# Patient Record
Sex: Female | Born: 1965 | Race: Black or African American | Hispanic: No | Marital: Married | Smoking: Never smoker
Health system: Southern US, Community
[De-identification: ages and names within clinical notes are randomized; demographics above are authoritative.]

## PROBLEM LIST (undated history)

## (undated) HISTORY — PX: BREAST BIOPSY: SHX20

---
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
## 2003-06-26 HISTORY — PX: CHOLECYSTECTOMY: SHX55

## 2010-10-12 ENCOUNTER — Other Ambulatory Visit: Payer: Self-pay | Admitting: Obstetrics & Gynecology

## 2010-10-12 DIAGNOSIS — Z1231 Encounter for screening mammogram for malignant neoplasm of breast: Secondary | ICD-10-CM

## 2010-10-20 ENCOUNTER — Ambulatory Visit (HOSPITAL_COMMUNITY)
Admission: RE | Admit: 2010-10-20 | Discharge: 2010-10-20 | Disposition: A | Discharge status: Home / Self Care | Payer: BC Managed Care – PPO | Source: Ambulatory Visit | Attending: Obstetrics & Gynecology | Admitting: Obstetrics & Gynecology

## 2010-10-20 DIAGNOSIS — Z1231 Encounter for screening mammogram for malignant neoplasm of breast: Secondary | ICD-10-CM | POA: Insufficient documentation

## 2012-06-16 ENCOUNTER — Other Ambulatory Visit: Payer: Self-pay | Admitting: Obstetrics & Gynecology

## 2012-06-16 DIAGNOSIS — Z1231 Encounter for screening mammogram for malignant neoplasm of breast: Secondary | ICD-10-CM

## 2012-06-17 ENCOUNTER — Ambulatory Visit (HOSPITAL_COMMUNITY)
Admission: RE | Admit: 2012-06-17 | Discharge: 2012-06-17 | Disposition: A | Discharge status: Home / Self Care | Payer: 59 | Source: Ambulatory Visit | Attending: Obstetrics & Gynecology | Admitting: Obstetrics & Gynecology

## 2012-06-17 DIAGNOSIS — Z1231 Encounter for screening mammogram for malignant neoplasm of breast: Secondary | ICD-10-CM | POA: Insufficient documentation

## 2012-06-24 ENCOUNTER — Other Ambulatory Visit: Payer: Self-pay | Admitting: Obstetrics & Gynecology

## 2012-06-24 DIAGNOSIS — R928 Other abnormal and inconclusive findings on diagnostic imaging of breast: Secondary | ICD-10-CM

## 2012-07-03 ENCOUNTER — Ambulatory Visit
Admission: RE | Admit: 2012-07-03 | Discharge: 2012-07-03 | Disposition: A | Discharge status: Home / Self Care | Payer: 59 | Source: Ambulatory Visit | Attending: Obstetrics & Gynecology | Admitting: Obstetrics & Gynecology

## 2012-07-03 ENCOUNTER — Other Ambulatory Visit: Payer: Self-pay | Admitting: Obstetrics & Gynecology

## 2012-07-03 DIAGNOSIS — R928 Other abnormal and inconclusive findings on diagnostic imaging of breast: Secondary | ICD-10-CM

## 2012-07-11 ENCOUNTER — Ambulatory Visit
Admission: RE | Admit: 2012-07-11 | Discharge: 2012-07-11 | Disposition: A | Discharge status: Home / Self Care | Payer: 59 | Source: Ambulatory Visit | Attending: Obstetrics & Gynecology | Admitting: Obstetrics & Gynecology

## 2012-07-11 DIAGNOSIS — R928 Other abnormal and inconclusive findings on diagnostic imaging of breast: Secondary | ICD-10-CM

## 2012-11-26 ENCOUNTER — Other Ambulatory Visit: Payer: Self-pay | Admitting: *Deleted

## 2012-11-27 NOTE — Telephone Encounter (Signed)
Error

## 2013-01-15 ENCOUNTER — Ambulatory Visit: Payer: Self-pay | Admitting: Obstetrics & Gynecology

## 2013-01-29 ENCOUNTER — Encounter: Payer: Self-pay | Admitting: Obstetrics & Gynecology

## 2013-01-29 ENCOUNTER — Ambulatory Visit (INDEPENDENT_AMBULATORY_CARE_PROVIDER_SITE_OTHER): Payer: 59 | Admitting: Obstetrics & Gynecology

## 2013-01-29 VITALS — BP 148/90 | HR 83 | Temp 98.3°F | Ht 68.0 in | Wt 249.0 lb

## 2013-01-29 DIAGNOSIS — Z01419 Encounter for gynecological examination (general) (routine) without abnormal findings: Secondary | ICD-10-CM

## 2013-01-29 DIAGNOSIS — Z0000 Encounter for general adult medical examination without abnormal findings: Secondary | ICD-10-CM

## 2013-01-29 NOTE — Progress Notes (Signed)
.   Subjective:     Grace Crosby is a 47 y.o. female here for a routine exam.  No current complaints: .  Personal health questionnaire reviewed: no.   Gynecologic History Patient's last menstrual period was 01/15/2013. Contraception: OCP (estrogen/progesterone) Last Pap:08/2011 . Results were: normal Last mammogram: 06/2012. Results were: normal  Obstetric History OB History   Grav Para Term Preterm Abortions TAB SAB Ect Mult Living                   The following portions of the patient's history were reviewed and updated as appropriate: allergies, current medications, past family history, past medical history, past social history, past surgical history and problem list.  Review of Systems Pertinent items are noted in HPI.    Objective:      General appearance: alert Breasts: normal appearance, no masses or tenderness Abdomen: soft, non-tender; bowel sounds normal; no masses,  no organomegaly Pelvic: cervix normal in appearance, external genitalia normal, no adnexal masses or tenderness, uterus normal size, shape, and consistency and vagina normal without discharge    Assessment:    Healthy female exam.   H/O a small fibroid uterus--stable Plan:    Return prn

## 2013-01-30 ENCOUNTER — Encounter: Payer: Self-pay | Admitting: Obstetrics & Gynecology

## 2013-02-02 LAB — PAP IG, CT-NG, RFX HPV ASCU: GC Probe Amp: NEGATIVE

## 2013-04-23 ENCOUNTER — Encounter: Payer: Self-pay | Admitting: Obstetrics & Gynecology

## 2013-04-23 ENCOUNTER — Ambulatory Visit (INDEPENDENT_AMBULATORY_CARE_PROVIDER_SITE_OTHER): Payer: 59 | Admitting: Obstetrics & Gynecology

## 2013-04-23 VITALS — BP 130/87 | HR 79 | Temp 98.2°F | Wt 250.0 lb

## 2013-04-23 DIAGNOSIS — N926 Irregular menstruation, unspecified: Secondary | ICD-10-CM

## 2013-04-23 DIAGNOSIS — Z3202 Encounter for pregnancy test, result negative: Secondary | ICD-10-CM

## 2013-04-23 LAB — POCT URINE PREGNANCY: Preg Test, Ur: NEGATIVE

## 2013-04-23 NOTE — Progress Notes (Signed)
Subjective:     Grace Crosby is a 47 y.o. female here for a problem exam.  Current complaints: pt reports  Having irregular menstrual cycles since June of this year, states she.  Personal health questionnaire reviewed: yes.   Gynecologic History Patient's last menstrual period was 04/10/2013. Contraception: none Last Pap: 01/2013. Results were: normal   Obstetric History OB History  No data available     The following portions of the patient's history were reviewed and updated as appropriate: allergies, current medications, past family history, past medical history, past social history, past surgical history and problem list.  Review of Systems Pertinent items are noted in HPI.    Objective:      General:  alert     Abdomen: soft, non-tender; bowel sounds normal; no masses,  no organomegaly   Vulva:  normal  Vagina: normal vagina  Cervix:  no lesions  Corpus: normal size, contour, position, consistency, mobility, non-tender  Adnexa:  normal adnexa       Assessment:   Unscheduled bleeding on COCP H/O small uterine fibroids   Plan:   Return for sonoHSG w/biopsy

## 2013-04-23 NOTE — Patient Instructions (Signed)

## 2013-04-24 LAB — WET PREP BY MOLECULAR PROBE: Gardnerella vaginalis: POSITIVE — AB

## 2013-04-27 ENCOUNTER — Other Ambulatory Visit: Payer: Self-pay | Admitting: *Deleted

## 2013-04-27 DIAGNOSIS — B9689 Other specified bacterial agents as the cause of diseases classified elsewhere: Secondary | ICD-10-CM

## 2013-04-27 MED ORDER — METRONIDAZOLE 500 MG PO TABS: 500.0000 mg | ORAL_TABLET | Freq: Two times a day (BID) | ORAL | Status: DC

## 2013-06-03 ENCOUNTER — Other Ambulatory Visit: Payer: Self-pay | Admitting: Obstetrics & Gynecology

## 2013-06-03 DIAGNOSIS — N926 Irregular menstruation, unspecified: Secondary | ICD-10-CM

## 2013-06-10 ENCOUNTER — Ambulatory Visit (INDEPENDENT_AMBULATORY_CARE_PROVIDER_SITE_OTHER): Payer: 59

## 2013-06-10 ENCOUNTER — Other Ambulatory Visit: Payer: Self-pay

## 2013-06-10 ENCOUNTER — Encounter: Payer: Self-pay | Admitting: Obstetrics & Gynecology

## 2013-06-10 ENCOUNTER — Other Ambulatory Visit: Payer: 59

## 2013-06-10 ENCOUNTER — Ambulatory Visit (INDEPENDENT_AMBULATORY_CARE_PROVIDER_SITE_OTHER): Payer: 59 | Admitting: Obstetrics & Gynecology

## 2013-06-10 VITALS — BP 150/89 | HR 103 | Temp 98.5°F | Ht 68.5 in | Wt 255.0 lb

## 2013-06-10 DIAGNOSIS — N926 Irregular menstruation, unspecified: Secondary | ICD-10-CM

## 2013-06-10 DIAGNOSIS — N939 Abnormal uterine and vaginal bleeding, unspecified: Secondary | ICD-10-CM | POA: Insufficient documentation

## 2013-06-10 DIAGNOSIS — N921 Excessive and frequent menstruation with irregular cycle: Secondary | ICD-10-CM

## 2013-06-10 DIAGNOSIS — D259 Leiomyoma of uterus, unspecified: Secondary | ICD-10-CM

## 2013-06-10 LAB — POCT URINE PREGNANCY: Preg Test, Ur: NEGATIVE

## 2013-06-10 NOTE — Patient Instructions (Signed)
ENDOMETRIAL BIOPSY POST-PROCEDURE INSTRUCTIONS  1. You may take Ibuprofen, Aleve or Tylenol for pain if needed.  Cramping should resolve within in 24 hours.  2. You may have a small amount of spotting.  You should wear a mini pad for the next few days.  3. You may have intercourse after 24 hours.  4. You need to call if you have any pelvic pain, fever, heavy bleeding or foul smelling vaginal discharge.  5. Shower or bathe as normal

## 2013-06-10 NOTE — Progress Notes (Signed)
SONOHYSTEROGRAM PROCEDURE NOTE  SONOHYSTEROGRAM PROCEDURE:   A time-out was performed confirming patient name, allergy status and procedure.  A UPT was negative.  A Graves speculum was placed in the vagina.  The cervix was prepped with betadine.  The cervix was grasped with a single tooth tenaculum.  The IUI catherter was primed with sterile water.  The IUI was introduced into the uterine cavity.  The single tooth tenaculum and speculum were removed.  The vaginal probe was placed.  The sterile water was instilled in the uterine cavity.   The IUI was removed.  The single tooth tenaculum was removed with minimal bleeding noted from the cervix.   An endometrial biopsy was performed prior to instillation of the sterile water.The patient tolerated the procedure well.  FINDINGS:  The endometrial lining was thin, homogeneous.  No intracavitary lesions.  Possible synechiae in the fundus.

## 2013-06-22 ENCOUNTER — Other Ambulatory Visit: Payer: Self-pay | Admitting: Obstetrics & Gynecology

## 2013-06-22 DIAGNOSIS — Z1231 Encounter for screening mammogram for malignant neoplasm of breast: Secondary | ICD-10-CM

## 2013-06-23 ENCOUNTER — Ambulatory Visit (INDEPENDENT_AMBULATORY_CARE_PROVIDER_SITE_OTHER): Payer: 59

## 2013-06-23 DIAGNOSIS — Z1231 Encounter for screening mammogram for malignant neoplasm of breast: Secondary | ICD-10-CM

## 2013-06-24 ENCOUNTER — Encounter: Payer: Self-pay | Admitting: Obstetrics & Gynecology

## 2013-06-24 ENCOUNTER — Ambulatory Visit (INDEPENDENT_AMBULATORY_CARE_PROVIDER_SITE_OTHER): Payer: 59 | Admitting: Obstetrics & Gynecology

## 2013-06-24 VITALS — BP 137/84 | HR 87 | Temp 98.3°F | Ht 68.0 in | Wt 255.0 lb

## 2013-06-24 DIAGNOSIS — N926 Irregular menstruation, unspecified: Secondary | ICD-10-CM

## 2013-06-24 DIAGNOSIS — D259 Leiomyoma of uterus, unspecified: Secondary | ICD-10-CM

## 2013-06-24 DIAGNOSIS — N939 Abnormal uterine and vaginal bleeding, unspecified: Secondary | ICD-10-CM

## 2013-06-24 NOTE — Progress Notes (Signed)
Subjective:     Grace Crosby is a 47 y.o. female here for a follow up exam for endometrial biopsy.  Current complaints: denies bleeding, discharge, pain or fever.  Personal health questionnaire reviewed: yes.   Gynecologic History Patient's last menstrual period was 06/04/2013. Contraception: OCP (estrogen/progesterone)  Last mammogram: 01/29/2013. Results were: normal  Obstetric History OB History  No data available     The following portions of the patient's history were reviewed and updated as appropriate: allergies, current medications, past family history, past medical history, past social history, past surgical history and problem list.  Review of Systems Pertinent items are noted in HPI.    Objective:     No exam today     Assessment:   AUB--O, L  Pathology results reviewed Plan:    Management options reviewed.  She elects to continue with medical management

## 2013-06-24 NOTE — Patient Instructions (Signed)
Fibroids Fibroids are lumps (tumors) that can occur any place in a woman's body. These lumps are not cancerous. Fibroids vary in size, weight, and where they grow. HOME CARE  Do not take aspirin.  Write down the number of pads or tampons you use during your period. Tell your doctor. This can help determine the best treatment for you. GET HELP RIGHT AWAY IF:  You have pain in your lower belly (abdomen) that is not helped with medicine.  You have cramps that are not helped with medicine.  You have more bleeding between or during your period.  You feel lightheaded or pass out (faint).  Your lower belly pain gets worse. MAKE SURE YOU:  Understand these instructions.  Will watch your condition.  Will get help right away if you are not doing well or get worse. Document Released: 07/14/2010 Document Revised: 09/03/2011 Document Reviewed: 07/14/2010 [employer] Patient Information 2014 [employer].  

## 2013-07-15 ENCOUNTER — Encounter: Payer: Self-pay | Admitting: Obstetrics & Gynecology

## 2013-11-17 ENCOUNTER — Other Ambulatory Visit: Payer: Self-pay | Admitting: Obstetrics & Gynecology

## 2013-11-17 NOTE — Telephone Encounter (Signed)
Please review

## 2014-01-29 ENCOUNTER — Encounter: Payer: Self-pay | Admitting: Obstetrics & Gynecology

## 2014-01-29 ENCOUNTER — Ambulatory Visit (INDEPENDENT_AMBULATORY_CARE_PROVIDER_SITE_OTHER): Payer: 59 | Admitting: Obstetrics & Gynecology

## 2014-01-29 ENCOUNTER — Other Ambulatory Visit: Payer: Self-pay | Admitting: *Deleted

## 2014-01-29 VITALS — BP 138/89 | HR 76 | Temp 98.2°F | Wt 264.8 lb

## 2014-01-29 DIAGNOSIS — Z01419 Encounter for gynecological examination (general) (routine) without abnormal findings: Secondary | ICD-10-CM

## 2014-01-29 DIAGNOSIS — D259 Leiomyoma of uterus, unspecified: Secondary | ICD-10-CM

## 2014-01-29 DIAGNOSIS — N939 Abnormal uterine and vaginal bleeding, unspecified: Secondary | ICD-10-CM

## 2014-01-29 MED ORDER — NORGESTIMATE-ETH ESTRADIOL 0.25-35 MG-MCG PO TABS: 1.0000 | ORAL_TABLET | Freq: Every day | ORAL | Status: DC

## 2014-01-30 NOTE — Progress Notes (Signed)
Subjective:     Grace Crosby is a 48 y.o. female here for a routine exam.  Current complaints: none.    Personal health questionnaire:  Is patient [name], have a family history of breast and/or ovarian cancer: no Is there a family history of uterine cancer diagnosed at age < 7, gastrointestinal cancer, urinary tract cancer, family member who is a Field seismologist syndrome-associated carrier: no Is the patient overweight and hypertensive, family history of diabetes, personal history of gestational diabetes or PCOS: yes Is patient over 53, have PCOS,  family history of premature CHD under age 31, diabetes, smoke, have hypertension or peripheral artery disease:  no At any time, has a partner hit, kicked or otherwise hurt or frightened you?: no Over the past 2 weeks, have you felt down, depressed or hopeless?: no Over the past 2 weeks, have you felt little interest or pleasure in doing things?:no   Gynecologic History Patient's last menstrual period was 01/14/2014. Contraception: OCP (estrogen/progesterone) Last Pap: 2014. Results were: normal Last mammogram: 12/14. Results were: normal  Obstetric History OB History  Gravida Para Term Preterm AB SAB TAB Ectopic Multiple Living  2 2 2       2     # Outcome Date GA Lbr Len/2nd Weight Sex Delivery Anes PTL Lv  2 TRM 11/03/01 [redacted]w[redacted]d  3.005 kg (6 lb 10 oz) F SVD None  Y  1 TRM 09/26/96 [redacted]w[redacted]d  3.43 kg (7 lb 9 oz) M SVD EPI  Y      History reviewed. No pertinent past medical history.  Past Surgical History  Procedure Laterality Date  . Cholecystectomy    . Appendectomy      Current outpatient prescriptions:norgestimate-ethinyl estradiol (SPRINTEC 28) 0.25-35 MG-MCG tablet, Take 1 tablet by mouth daily., Disp: 28 tablet, Rfl: 11 No Known Allergies  History  Substance Use Topics  . Smoking status: Never Smoker   . Smokeless tobacco: Never Used  . Alcohol Use: No    Family History  Problem Relation Age of Onset  . Diabetes  Mother   . Heart disease Mother   . Glaucoma Mother   . Heart disease Father   . Cancer Maternal Grandmother     cervical  . Stroke Maternal Grandfather   . Stroke Paternal Grandmother   . Cancer Paternal Grandfather       Review of Systems  Constitutional: negative for fatigue and weight loss Respiratory: negative for cough and wheezing Cardiovascular: negative for chest pain, fatigue and palpitations Gastrointestinal: negative for abdominal pain and change in bowel habits Musculoskeletal:negative for myalgias Neurological: negative for gait problems and tremors Behavioral/Psych: negative for abusive relationship, depression Endocrine: negative for temperature intolerance   Genitourinary:negative for abnormal menstrual periods, genital lesions, hot flashes, sexual problems and vaginal discharge Integument/breast: negative for breast lump, breast tenderness, nipple discharge and skin lesion(s)    Objective:       BP 138/89  Pulse 76  Temp(Src) 98.2 F (36.8 C)  Wt 120.112 kg (264 lb 12.8 oz)  LMP 01/14/2014 General:   alert  Skin:   no rash or abnormalities  Lungs:   clear to auscultation bilaterally  Heart:   regular rate and rhythm, S1, S2 normal, no murmur, click, rub or gallop  Breasts:   normal without suspicious masses, skin or nipple changes or axillary nodes  Abdomen:  normal findings: no organomegaly, soft, non-tender and no hernia  Pelvis:  External genitalia: normal general appearance Urinary system: urethral meatus normal and bladder without fullness,  nontender Vaginal: normal without tenderness, induration or masses Cervix: normal appearance Adnexa: normal bimanual exam Uterus: anteverted and non-tender, normal size   Lab Review  Labs reviewed no Radiologic studies reviewed no     Assessment:    Healthy female exam.   Fibroid uterus--AUB w/good response to medical management Plan:    Education reviewed: calcium supplements, low fat, low  cholesterol diet and weight bearing exercise.  Follow up as needed or 1 yr

## 2014-01-30 NOTE — Patient Instructions (Signed)

## 2014-02-04 ENCOUNTER — Ambulatory Visit: Payer: 59 | Admitting: Obstetrics & Gynecology

## 2014-04-26 ENCOUNTER — Encounter: Payer: Self-pay | Admitting: Obstetrics & Gynecology

## 2014-06-21 ENCOUNTER — Encounter: Payer: Self-pay | Admitting: *Deleted

## 2014-06-22 ENCOUNTER — Encounter: Payer: Self-pay | Admitting: Obstetrics & Gynecology

## 2014-08-31 ENCOUNTER — Other Ambulatory Visit: Payer: Self-pay | Admitting: Obstetrics & Gynecology

## 2014-08-31 DIAGNOSIS — Z1231 Encounter for screening mammogram for malignant neoplasm of breast: Secondary | ICD-10-CM

## 2014-09-01 ENCOUNTER — Ambulatory Visit (INDEPENDENT_AMBULATORY_CARE_PROVIDER_SITE_OTHER): Payer: 59

## 2014-09-01 DIAGNOSIS — Z1231 Encounter for screening mammogram for malignant neoplasm of breast: Secondary | ICD-10-CM

## 2015-01-13 ENCOUNTER — Telehealth: Payer: Self-pay | Admitting: *Deleted

## 2015-01-13 DIAGNOSIS — N939 Abnormal uterine and vaginal bleeding, unspecified: Secondary | ICD-10-CM

## 2015-01-13 MED ORDER — NORGESTIMATE-ETH ESTRADIOL 0.25-35 MG-MCG PO TABS: 1.0000 | ORAL_TABLET | Freq: Every day | ORAL | Status: DC

## 2015-01-13 NOTE — Telephone Encounter (Signed)
The pharmacy contacted the office via fax on behalf of the patient requesting a refill on Ortho Cyclen 28 tablet.   Per nursing protocol prescription refilled. Patient is due an annual exam and message forwarded to Grace Crosby to schedule patient.

## 2015-02-02 ENCOUNTER — Ambulatory Visit: Payer: 59 | Admitting: Obstetrics & Gynecology

## 2015-02-17 ENCOUNTER — Ambulatory Visit: Payer: Self-pay | Admitting: Certified Nurse Midwife

## 2015-02-23 ENCOUNTER — Ambulatory Visit (INDEPENDENT_AMBULATORY_CARE_PROVIDER_SITE_OTHER): Payer: 59 | Admitting: Certified Nurse Midwife

## 2015-02-23 ENCOUNTER — Encounter: Payer: Self-pay | Admitting: Certified Nurse Midwife

## 2015-02-23 VITALS — BP 141/93 | HR 84 | Temp 98.1°F | Ht 69.0 in | Wt 265.0 lb

## 2015-02-23 DIAGNOSIS — Z01419 Encounter for gynecological examination (general) (routine) without abnormal findings: Secondary | ICD-10-CM

## 2015-02-23 DIAGNOSIS — N939 Abnormal uterine and vaginal bleeding, unspecified: Secondary | ICD-10-CM

## 2015-02-23 MED ORDER — NORGESTIMATE-ETH ESTRADIOL 0.25-35 MG-MCG PO TABS: 1.0000 | ORAL_TABLET | Freq: Every day | ORAL | Status: DC

## 2015-02-23 NOTE — Addendum Note (Signed)
Addended by: Lewie Loron D on: 02/23/2015 11:33 AM   Modules accepted: Orders

## 2015-02-23 NOTE — Progress Notes (Signed)
Patient ID: Grace Crosby, female   DOB: [birth year], 49 y.o.   MRN: [id number]    Subjective:        Grace Crosby is a 49 y.o. female here for a routine exam.  Current complaints:  None.  Menses are regular, lasting about 5 days, denies any clots. On Sprintec and likes it. Recently placed on HTN medications.   Exercising 3 times a week with a trainer.  Denies any change in vaginal discharge  Personal health questionnaire:  Is patient [name], have a family history of breast and/or ovarian cancer: no Is there a family history of uterine cancer diagnosed at age < 64, gastrointestinal cancer, urinary tract cancer, family member who is a Field seismologist syndrome-associated carrier: no Is the patient overweight and hypertensive, family history of diabetes, personal history of gestational diabetes, preeclampsia or PCOS: yes Is patient over 51, have PCOS,  family history of premature CHD under age 41, diabetes, smoke, have hypertension or peripheral artery disease:  Yes.  Dad: MI.  Mother: DM, HTN At any time, has a partner hit, kicked or otherwise hurt or frightened you?: no Over the past 2 weeks, have you felt down, depressed or hopeless?: no Over the past 2 weeks, have you felt little interest or pleasure in doing things?:no   Gynecologic History Patient's last menstrual period was 02/10/2015. Contraception: OCP (estrogen/progesterone) Last Pap: 01/2013. Results were: normal Last mammogram: 08/2014. Results were: normal  Obstetric History OB History  Gravida Para Term Preterm AB SAB TAB Ectopic Multiple Living  2 2 2       2     # Outcome Date GA Lbr Len/2nd Weight Sex Delivery Anes PTL Lv  2 Term 11/03/01 [redacted]w[redacted]d  6 lb 10 oz (3.005 kg) F Vag-Spont None  Y  1 Term 09/26/96 [redacted]w[redacted]d  7 lb 9 oz (3.43 kg) M Vag-Spont EPI  Y      History reviewed. No pertinent past medical history.  Past Surgical History  Procedure Laterality Date  . Cholecystectomy    . Appendectomy        Current outpatient prescriptions:  .  Calcium Carbonate (CALTRATE 600 PO), Take by mouth., Disp: , Rfl:  .  glucosamine-chondroitin 500-400 MG tablet, Take 1 tablet by mouth 3 (three) times daily., Disp: , Rfl:  .  losartan (COZAAR) 25 MG tablet, Take 25 mg by mouth daily., Disp: , Rfl:  .  Multiple Vitamins-Minerals (MULTIVITAMIN WITH MINERALS) tablet, Take 1 tablet by mouth daily., Disp: , Rfl:  .  norgestimate-ethinyl estradiol (SPRINTEC 28) 0.25-35 MG-MCG tablet, Take 1 tablet by mouth daily., Disp: 28 tablet, Rfl: 2 .  vitamin C (ASCORBIC ACID) 500 MG tablet, Take 500 mg by mouth daily., Disp: , Rfl:  No Known Allergies  Social History  Substance Use Topics  . Smoking status: Never Smoker   . Smokeless tobacco: Never Used  . Alcohol Use: No    Family History  Problem Relation Age of Onset  . Diabetes Mother   . Heart disease Mother   . Glaucoma Mother   . Heart disease Father   . Cancer Maternal Grandmother     cervical  . Stroke Maternal Grandfather   . Stroke Paternal Grandmother   . Cancer Paternal Grandfather       Review of Systems  Constitutional: negative for fatigue and weight loss Respiratory: negative for cough and wheezing Cardiovascular: negative for chest pain, fatigue and palpitations Gastrointestinal: negative for abdominal pain and change in bowel habits Musculoskeletal:negative for myalgias  Neurological: negative for gait problems and tremors Behavioral/Psych: negative for abusive relationship, depression Endocrine: negative for temperature intolerance   Genitourinary:negative for abnormal menstrual periods, genital lesions, hot flashes, sexual problems and vaginal discharge Integument/breast: negative for breast lump, breast tenderness, nipple discharge and skin lesion(s)    Objective:       BP 141/93 mmHg  Pulse 84  Temp(Src) 98.1 F (36.7 C)  Ht 5\' 9"  (1.753 m)  Wt 265 lb (120.203 kg)  BMI 39.12 kg/m2  LMP 02/10/2015 General:   alert   Skin:   no rash or abnormalities  Lungs:   clear to auscultation bilaterally  Heart:   regular rate and rhythm, S1, S2 normal, no murmur, click, rub or gallop  Breasts:   normal without suspicious masses, skin or nipple changes or axillary nodes  Abdomen:  normal findings: no organomegaly, soft, non-tender and no hernia  Pelvis:  External genitalia: normal general appearance Urinary system: urethral meatus normal and bladder without fullness, nontender Vaginal: normal without tenderness, induration or masses Cervix: normal appearance, friable Adnexa: normal bimanual exam Uterus: anteverted and non-tender, normal size   Lab Review Urine pregnancy test Labs reviewed yes Radiologic studies reviewed yes  50% of 30 min visit spent on counseling and coordination of care.   Assessment:    Healthy female exam.   H/O leioma  Friable cervix  Plan:    Education reviewed: calcium supplements, depression evaluation, low fat, low cholesterol diet, safe sex/STD prevention, self breast exams, skin cancer screening and weight bearing exercise. Contraception: OCP (estrogen/progesterone). Follow up in: 1 year. up to date on mammogram   Meds ordered this encounter  Medications  . Multiple Vitamins-Minerals (MULTIVITAMIN WITH MINERALS) tablet    Sig: Take 1 tablet by mouth daily.  . vitamin C (ASCORBIC ACID) 500 MG tablet    Sig: Take 500 mg by mouth daily.  . Calcium Carbonate (CALTRATE 600 PO)    Sig: Take by mouth.  Marland Kitchen glucosamine-chondroitin 500-400 MG tablet    Sig: Take 1 tablet by mouth 3 (three) times daily.  Marland Kitchen losartan (COZAAR) 25 MG tablet    Sig: Take 25 mg by mouth daily.   No orders of the defined types were placed in this encounter.

## 2015-02-25 LAB — PAP, TP IMAGING W/ HPV RNA, RFLX HPV TYPE 16,18/45: HPV MRNA, HIGH RISK: DETECTED — AB

## 2015-02-26 LAB — SURESWAB, VAGINOSIS/VAGINITIS PLUS
Atopobium vaginae: NOT DETECTED Log (cells/mL)
C. ALBICANS, DNA: NOT DETECTED
C. GLABRATA, DNA: NOT DETECTED
C. PARAPSILOSIS, DNA: NOT DETECTED
C. TRACHOMATIS RNA, TMA: NOT DETECTED
C. TROPICALIS, DNA: NOT DETECTED
Gardnerella vaginalis: 5.1 Log (cells/mL)
LACTOBACILLUS SPECIES: NOT DETECTED Log (cells/mL)
MEGASPHAERA SPECIES: NOT DETECTED Log (cells/mL)
N. GONORRHOEAE RNA, TMA: NOT DETECTED
T. vaginalis RNA, QL TMA: NOT DETECTED

## 2015-03-03 LAB — HPV TYPE 16 AND 18/45 RNA
HPV TYPE 16 RNA: NOT DETECTED
HPV Type 18/45 RNA: NOT DETECTED

## 2015-03-08 ENCOUNTER — Ambulatory Visit: Payer: 59 | Admitting: Certified Nurse Midwife

## 2015-07-26 ENCOUNTER — Other Ambulatory Visit: Payer: Self-pay | Admitting: Family Medicine

## 2015-07-26 DIAGNOSIS — Z1231 Encounter for screening mammogram for malignant neoplasm of breast: Secondary | ICD-10-CM

## 2015-09-07 ENCOUNTER — Ambulatory Visit: Payer: 59

## 2015-09-09 ENCOUNTER — Ambulatory Visit (INDEPENDENT_AMBULATORY_CARE_PROVIDER_SITE_OTHER): Payer: 59

## 2015-09-09 DIAGNOSIS — Z1231 Encounter for screening mammogram for malignant neoplasm of breast: Secondary | ICD-10-CM

## 2016-03-07 ENCOUNTER — Encounter: Payer: Self-pay | Admitting: Certified Nurse Midwife

## 2016-03-07 ENCOUNTER — Encounter: Payer: Self-pay | Admitting: *Deleted

## 2016-03-07 ENCOUNTER — Ambulatory Visit (INDEPENDENT_AMBULATORY_CARE_PROVIDER_SITE_OTHER): Payer: 59 | Admitting: Certified Nurse Midwife

## 2016-03-07 VITALS — BP 129/83 | HR 88 | Ht 68.5 in

## 2016-03-07 DIAGNOSIS — Z01419 Encounter for gynecological examination (general) (routine) without abnormal findings: Secondary | ICD-10-CM

## 2016-03-07 DIAGNOSIS — Z3041 Encounter for surveillance of contraceptive pills: Secondary | ICD-10-CM

## 2016-03-07 DIAGNOSIS — N939 Abnormal uterine and vaginal bleeding, unspecified: Secondary | ICD-10-CM

## 2016-03-07 MED ORDER — NORGESTIMATE-ETH ESTRADIOL 0.25-35 MG-MCG PO TABS: 1.0000 | ORAL_TABLET | Freq: Every day | ORAL | 12 refills | Status: DC

## 2016-03-07 NOTE — Progress Notes (Signed)
Subjective:        Grace Crosby is a 50 y.o. female here for a routine exam.  Current complaints: none.  Menses are regular, lasting about 5 days, denies any clots. On Sprintec and likes it. Recently placed on HTN medications last year.   Exercising 3 times a week with a trainer.  Denies any change in vaginal discharge.  Declines STD testing.  Married, no new partners. Is happy with Sprintec.      Personal health questionnaire:  Is patient [name], have a family history of breast and/or ovarian cancer: no Is there a family history of uterine cancer diagnosed at age < 75, gastrointestinal cancer, urinary tract cancer, family member who is a Field seismologist syndrome-associated carrier: no Is the patient overweight and hypertensive, family history of diabetes, personal history of gestational diabetes, preeclampsia or PCOS: yes Is patient over 72, have PCOS,  family history of premature CHD under age 32, diabetes, smoke, have hypertension or peripheral artery disease:  Yes.  Dad: MI.  Mother: DM, HTN At any time, has a partner hit, kicked or otherwise hurt or frightened you?: no Over the past 2 weeks, have you felt down, depressed or hopeless?: no Over the past 2 weeks, have you felt little interest or pleasure in doing things?:no  Gynecologic History Patient's last menstrual period was 02/09/2016. Contraception: OCP (estrogen/progesterone) Last Pap: 02/23/15. Results were: +HPV, negative pap Last mammogram: 09/09/15. Results were: normal  Obstetric History OB History  Gravida Para Term Preterm AB Living  2 2 2     2   SAB TAB Ectopic Multiple Live Births          2    # Outcome Date GA Lbr Len/2nd Weight Sex Delivery Anes PTL Lv  2 Term 11/03/01 [redacted]w[redacted]d  6 lb 10 oz (3.005 kg) F Vag-Spont None  LIV  1 Term 09/26/96 [redacted]w[redacted]d  7 lb 9 oz (3.43 kg) M Vag-Spont EPI  LIV      History reviewed. No pertinent past medical history.  Past Surgical History:  Procedure Laterality Date  .  APPENDECTOMY    . CHOLECYSTECTOMY       Current Outpatient Prescriptions:  .  losartan (COZAAR) 25 MG tablet, Take 25 mg by mouth daily., Disp: , Rfl:  .  norgestimate-ethinyl estradiol (SPRINTEC 28) 0.25-35 MG-MCG tablet, Take 1 tablet by mouth daily., Disp: 28 tablet, Rfl: 12 .  Calcium Carbonate (CALTRATE 600 PO), Take by mouth., Disp: , Rfl:  .  glucosamine-chondroitin 500-400 MG tablet, Take 1 tablet by mouth 3 (three) times daily., Disp: , Rfl:  .  Multiple Vitamins-Minerals (MULTIVITAMIN WITH MINERALS) tablet, Take 1 tablet by mouth daily., Disp: , Rfl:  .  vitamin C (ASCORBIC ACID) 500 MG tablet, Take 500 mg by mouth daily., Disp: , Rfl:  No Known Allergies  Social History  Substance Use Topics  . Smoking status: Never Smoker  . Smokeless tobacco: Never Used  . Alcohol use No    Family History  Problem Relation Age of Onset  . Diabetes Mother   . Heart disease Mother   . Glaucoma Mother   . Heart disease Father   . Cancer Maternal Grandmother     cervical  . Stroke Maternal Grandfather   . Stroke Paternal Grandmother   . Cancer Paternal Grandfather       Review of Systems  Constitutional: negative for fatigue and weight loss Respiratory: negative for cough and wheezing Cardiovascular: negative for chest pain, fatigue and palpitations Gastrointestinal:  negative for abdominal pain and change in bowel habits Musculoskeletal:negative for myalgias Neurological: negative for gait problems and tremors Behavioral/Psych: negative for abusive relationship, depression Endocrine: negative for temperature intolerance   Genitourinary:negative for abnormal menstrual periods, genital lesions, hot flashes, sexual problems and vaginal discharge Integument/breast: negative for breast lump, breast tenderness, nipple discharge and skin lesion(s)    Objective:       BP 129/83   Pulse 88   Ht 5' 8.5" (1.74 m)   LMP 02/09/2016  General:   alert  Skin:   no rash or abnormalities   Lungs:   clear to auscultation bilaterally  Heart:   regular rate and rhythm, S1, S2 normal, no murmur, click, rub or gallop  Breasts:   normal without suspicious masses, skin or nipple changes or axillary nodes  Abdomen:  normal findings: no organomegaly, soft, non-tender and no hernia  Pelvis:  External genitalia: normal general appearance Urinary system: urethral meatus normal and bladder without fullness, nontender Vaginal: normal without tenderness, induration or masses Cervix: normal appearance Adnexa: normal bimanual exam Uterus: anteverted and non-tender, normal size   Lab Review Urine pregnancy test Labs reviewed yes Radiologic studies reviewed yes  50% of 30 min visit spent on counseling and coordination of care.   Assessment:    Healthy female exam.   Contraception management   H/O HPV  Plan:    Education reviewed: calcium supplements, depression evaluation, low fat, low cholesterol diet, safe sex/STD prevention, self breast exams, skin cancer screening and weight bearing exercise. Contraception: OCP (estrogen/progesterone). Follow up in: 1 year.   Meds ordered this encounter  Medications  . norgestimate-ethinyl estradiol (SPRINTEC 28) 0.25-35 MG-MCG tablet    Sig: Take 1 tablet by mouth daily.    Dispense:  28 tablet    Refill:  12   No orders of the defined types were placed in this encounter.   Possible management options include: [facility] Follow up as needed.

## 2016-03-10 LAB — NUSWAB VG, CANDIDA 6SP
CANDIDA ALBICANS, NAA: NEGATIVE
CANDIDA PARAPSILOSIS, NAA: NEGATIVE
CANDIDA TROPICALIS, NAA: NEGATIVE
Candida glabrata, NAA: NEGATIVE
Candida krusei, NAA: NEGATIVE
Candida lusitaniae, NAA: NEGATIVE
Trich vag by NAA: NEGATIVE

## 2016-03-12 LAB — PAP IG AND HPV HIGH-RISK
HPV, high-risk: NEGATIVE
PAP SMEAR COMMENT: 0

## 2016-05-24 ENCOUNTER — Telehealth: Payer: Self-pay | Admitting: *Deleted

## 2016-05-24 NOTE — Telephone Encounter (Signed)
Pt was called in and was very agitated that a voicemail that she left regarding billing 3 days ago was nor returned, it was from a office visit date of 03/07/2016, she stated that [place] billed her a a procedure that was billed incorrectly and she stated" That being a patient for the last 6 years that this was very unacceptable!!!" Apologized for the inconvenice and told her and told her that we are now in a billing service area and I gave her the contact for the [facility] billing rep. Ivar Drape. Patient also stated that she left several messages for Office manager and billing, informed her there was no one that worked in billing located in this office.

## 2016-05-31 NOTE — Telephone Encounter (Signed)
error 

## 2016-08-23 ENCOUNTER — Other Ambulatory Visit: Payer: Self-pay | Admitting: Family Medicine

## 2016-08-23 DIAGNOSIS — Z1231 Encounter for screening mammogram for malignant neoplasm of breast: Secondary | ICD-10-CM

## 2016-09-19 ENCOUNTER — Ambulatory Visit (INDEPENDENT_AMBULATORY_CARE_PROVIDER_SITE_OTHER): Payer: 59

## 2016-09-19 DIAGNOSIS — Z1231 Encounter for screening mammogram for malignant neoplasm of breast: Secondary | ICD-10-CM

## 2017-02-28 ENCOUNTER — Other Ambulatory Visit: Payer: Self-pay | Admitting: Certified Nurse Midwife

## 2017-02-28 DIAGNOSIS — Z3041 Encounter for surveillance of contraceptive pills: Secondary | ICD-10-CM

## 2017-02-28 NOTE — Telephone Encounter (Signed)
Patient needs to schedule an annual exam, please. [place] for 1 refill while waiting for annual exam. Thank you.  Katesha Eichel

## 2017-02-28 NOTE — Telephone Encounter (Signed)
Please review for refill.  

## 2017-03-12 ENCOUNTER — Ambulatory Visit: Payer: 59 | Admitting: Certified Nurse Midwife

## 2017-03-18 ENCOUNTER — Encounter: Payer: Self-pay | Admitting: Certified Nurse Midwife

## 2017-03-18 ENCOUNTER — Ambulatory Visit (INDEPENDENT_AMBULATORY_CARE_PROVIDER_SITE_OTHER): Payer: 59 | Admitting: Certified Nurse Midwife

## 2017-03-18 VITALS — BP 142/90 | HR 88 | Ht 68.0 in | Wt 259.2 lb

## 2017-03-18 DIAGNOSIS — Z1151 Encounter for screening for human papillomavirus (HPV): Secondary | ICD-10-CM

## 2017-03-18 DIAGNOSIS — Z3041 Encounter for surveillance of contraceptive pills: Secondary | ICD-10-CM

## 2017-03-18 DIAGNOSIS — B3731 Acute candidiasis of vulva and vagina: Secondary | ICD-10-CM

## 2017-03-18 DIAGNOSIS — Z124 Encounter for screening for malignant neoplasm of cervix: Secondary | ICD-10-CM | POA: Diagnosis not present

## 2017-03-18 DIAGNOSIS — B373 Candidiasis of vulva and vagina: Secondary | ICD-10-CM

## 2017-03-18 DIAGNOSIS — Z01419 Encounter for gynecological examination (general) (routine) without abnormal findings: Secondary | ICD-10-CM

## 2017-03-18 DIAGNOSIS — N898 Other specified noninflammatory disorders of vagina: Secondary | ICD-10-CM

## 2017-03-18 MED ORDER — NORGESTIMATE-ETH ESTRADIOL 0.25-35 MG-MCG PO TABS: 1.0000 | ORAL_TABLET | Freq: Every day | ORAL | 1 refills | Status: DC

## 2017-03-18 MED ORDER — FLUCONAZOLE 200 MG PO TABS: 200.0000 mg | ORAL_TABLET | Freq: Once | ORAL | 0 refills | Status: AC

## 2017-03-18 MED ORDER — TERCONAZOLE 0.8 % VA CREA: 1.0000 | TOPICAL_CREAM | Freq: Every day | VAGINAL | 0 refills | Status: DC

## 2017-03-18 NOTE — Progress Notes (Signed)
Subjective:        Grace Crosby is a 51 y.o. female here for a routine exam. Current complaints: none.  Menses are regular, lasting about 5 days, denies any clots. On Sprintec and likes it. Recently placed on HTN medications last year. Exercising 3 times a week with a trainer. Denies any change in vaginal discharge.  Declines STD testing.  Married, no new partners. Is happy with Sprintec.      Personal health questionnaire:  Is patient Grace Crosby, have a family history of breast and/or ovarian cancer: no Is there a family history of uterine cancer diagnosed at age < 56, gastrointestinal cancer, urinary tract cancer, family member who is a Field seismologist syndrome-associated carrier: no Is the patient overweight and hypertensive, family history of diabetes, personal history of gestational diabetes, preeclampsia or PCOS: yes Is patient over 22, have PCOS, family history of premature CHD under age 39, diabetes, smoke, have hypertension or peripheral artery disease: Yes. Dad: MI. Mother: DM, HTN At any time, has a partner hit, kicked or otherwise hurt or frightened you?: no Over the past 2 weeks, have you felt down, depressed or hopeless?: no Over the past 2 weeks, have you felt little interest or pleasure in doing things?:no  Gynecologic History Patient's last menstrual period was 03/07/2017. Contraception: OCP (estrogen/progesterone) Last Pap: 03/07/16. Results were: normal Last mammogram: 09/19/16. Results were: normal  Obstetric History OB History  Gravida Para Term Preterm AB Living  2 2 2     2   SAB TAB Ectopic Multiple Live Births          2    # Outcome Date GA Lbr Len/2nd Weight Sex Delivery Anes PTL Lv  2 Term 11/03/01 [redacted]w[redacted]d  6 lb 10 oz (3.005 kg) F Vag-Spont None  LIV  1 Term 09/26/96 [redacted]w[redacted]d  7 lb 9 oz (3.43 kg) M Vag-Spont EPI  LIV      History reviewed. No pertinent past medical history.  Past Surgical History:  Procedure Laterality Date  . APPENDECTOMY   1977  . CHOLECYSTECTOMY  2005     Current Outpatient Prescriptions:  .  Calcium Carbonate (CALTRATE 600 PO), Take by mouth., Disp: , Rfl:  .  losartan (COZAAR) 25 MG tablet, Take 25 mg by mouth daily., Disp: , Rfl:  .  Multiple Vitamins-Minerals (MULTIVITAMIN WITH MINERALS) tablet, Take 1 tablet by mouth daily., Disp: , Rfl:  .  norgestimate-ethinyl estradiol (SPRINTEC 28) 0.25-35 MG-MCG tablet, Take 1 tablet by mouth daily., Disp: 28 tablet, Rfl: 1 .  vitamin C (ASCORBIC ACID) 500 MG tablet, Take 500 mg by mouth daily., Disp: , Rfl:  .  fluconazole (DIFLUCAN) 200 MG tablet, Take 1 tablet (200 mg total) by mouth once. Repeat dose in 48-72 hours., Disp: 3 tablet, Rfl: 0 .  glucosamine-chondroitin 500-400 MG tablet, Take 1 tablet by mouth 3 (three) times daily., Disp: , Rfl:  .  terconazole (TERAZOL 3) 0.8 % vaginal cream, Place 1 applicator vaginally at bedtime., Disp: 20 g, Rfl: 0 No Known Allergies  Social History  Substance Use Topics  . Smoking status: Never Smoker  . Smokeless tobacco: Never Used  . Alcohol use No    Family History  Problem Relation Age of Onset  . Diabetes Mother   . Heart disease Mother   . Glaucoma Mother   . Heart disease Father   . Cancer Maternal Grandmother        cervical  . Stroke Maternal Grandfather   . Stroke Paternal  Grandmother   . Cancer Paternal Grandfather       Review of Systems  Constitutional: negative for fatigue and weight loss Respiratory: negative for cough and wheezing Cardiovascular: negative for chest pain, fatigue and palpitations Gastrointestinal: negative for abdominal pain and change in bowel habits Musculoskeletal:negative for myalgias Neurological: negative for gait problems and tremors Behavioral/Psych: negative for abusive relationship, depression Endocrine: negative for temperature intolerance    Genitourinary:negative for abnormal menstrual periods, genital lesions, hot flashes, sexual problems and vaginal  discharge Integument/breast: negative for breast lump, breast tenderness, nipple discharge and skin lesion(s)    Objective:       BP (!) 142/90   Pulse 88   Ht 5\' 8"  (1.727 m)   Wt 259 lb 3.2 oz (117.6 kg)   LMP 03/07/2017   BMI 39.41 kg/m  General:   alert  Skin:   no rash or abnormalities  Lungs:   clear to auscultation bilaterally  Heart:   regular rate and rhythm, S1, S2 normal, no murmur, click, rub or gallop  Breasts:   normal without suspicious masses, skin or nipple changes or axillary nodes  Abdomen:  normal findings: no organomegaly, soft, non-tender and no hernia  Pelvis:  External genitalia: normal general appearance Urinary system: urethral meatus normal and bladder without fullness, nontender Vaginal: normal without tenderness, induration or masses, + white, chunky discharge noted Cervix: normal appearance, firable Adnexa: normal bimanual exam Uterus: anteverted and non-tender, normal size   Lab Review Urine pregnancy test Labs reviewed yes Radiologic studies reviewed yes  50% of 30 min visit spent on counseling and coordination of care.    Assessment & Plan    Healthy female exam.   1. Encounter for well woman exam with routine gynecological exam      Doing well.  Followed by PCP for blood pressure.  No perimenopausal symptoms. Upto date on Mammogram.  - Cytology - PAP  2. Vaginal discharge      - Cervicovaginal ancillary only  3. Encounter for surveillance of contraceptive pills      - norgestimate-ethinyl estradiol (Weed 28) 0.25-35 MG-MCG tablet; Take 1 tablet by mouth daily.  Dispense: 28 tablet; Refill: 1  4. Yeast vaginitis      - Cervicovaginal ancillary only - fluconazole (DIFLUCAN) 200 MG tablet; Take 1 tablet (200 mg total) by mouth once. Repeat dose in 48-72 hours.  Dispense: 3 tablet; Refill: 0 - terconazole (TERAZOL 3) 0.8 % vaginal cream; Place 1 applicator vaginally at bedtime.  Dispense: 20 g; Refill: 0    Education reviewed:  calcium supplements, depression evaluation, low fat, low cholesterol diet, safe sex/STD prevention, self breast exams, skin cancer screening and weight bearing exercise. Contraception: OCP (estrogen/progesterone). Follow up in: 1 year.   Meds ordered this encounter  Medications  . norgestimate-ethinyl estradiol (SPRINTEC 28) 0.25-35 MG-MCG tablet    Sig: Take 1 tablet by mouth daily.    Dispense:  28 tablet    Refill:  1  . fluconazole (DIFLUCAN) 200 MG tablet    Sig: Take 1 tablet (200 mg total) by mouth once. Repeat dose in 48-72 hours.    Dispense:  3 tablet    Refill:  0  . terconazole (TERAZOL 3) 0.8 % vaginal cream    Sig: Place 1 applicator vaginally at bedtime.    Dispense:  20 g    Refill:  0   OTC probiotics.   Follow up as needed.

## 2017-03-18 NOTE — Progress Notes (Signed)
Patient is in the office for annual, last pap 03-07-16.

## 2017-03-19 LAB — CERVICOVAGINAL ANCILLARY ONLY
Bacterial vaginitis: NEGATIVE
CANDIDA VAGINITIS: NEGATIVE

## 2017-03-20 LAB — CYTOLOGY - PAP
Diagnosis: UNDETERMINED — AB
HPV: NOT DETECTED

## 2017-03-22 ENCOUNTER — Telehealth: Payer: Self-pay

## 2017-03-22 NOTE — Telephone Encounter (Signed)
Patient notified of results and need to get PAP done in 1 year.

## 2017-03-22 NOTE — Telephone Encounter (Signed)
-----   Message from Morene Crocker, CNM sent at 03/20/2017  2:24 PM EDT ----- Please let her know their were some changes on her pap smear nothing to worry about.  We will repeat her pap smear in one year.  Thank you.

## 2017-06-10 ENCOUNTER — Other Ambulatory Visit: Payer: Self-pay | Admitting: Certified Nurse Midwife

## 2017-06-10 DIAGNOSIS — Z3041 Encounter for surveillance of contraceptive pills: Secondary | ICD-10-CM

## 2017-08-22 ENCOUNTER — Other Ambulatory Visit: Payer: Self-pay

## 2017-08-22 ENCOUNTER — Other Ambulatory Visit: Payer: Self-pay | Admitting: Obstetrics

## 2017-08-22 DIAGNOSIS — Z3041 Encounter for surveillance of contraceptive pills: Secondary | ICD-10-CM

## 2017-10-17 ENCOUNTER — Other Ambulatory Visit: Payer: Self-pay | Admitting: Obstetrics

## 2017-10-17 DIAGNOSIS — Z3041 Encounter for surveillance of contraceptive pills: Secondary | ICD-10-CM

## 2017-11-07 ENCOUNTER — Other Ambulatory Visit: Payer: Self-pay | Admitting: Family Medicine

## 2017-11-07 DIAGNOSIS — Z1231 Encounter for screening mammogram for malignant neoplasm of breast: Secondary | ICD-10-CM

## 2017-11-27 ENCOUNTER — Ambulatory Visit (INDEPENDENT_AMBULATORY_CARE_PROVIDER_SITE_OTHER): Payer: 59

## 2017-11-27 DIAGNOSIS — Z1231 Encounter for screening mammogram for malignant neoplasm of breast: Secondary | ICD-10-CM | POA: Diagnosis not present

## 2018-03-19 ENCOUNTER — Other Ambulatory Visit: Payer: Self-pay

## 2018-03-19 ENCOUNTER — Encounter: Payer: Self-pay | Admitting: Obstetrics and Gynecology

## 2018-03-19 ENCOUNTER — Ambulatory Visit (INDEPENDENT_AMBULATORY_CARE_PROVIDER_SITE_OTHER): Payer: 59 | Admitting: Obstetrics and Gynecology

## 2018-03-19 VITALS — BP 152/87 | HR 92 | Ht 68.0 in | Wt 274.3 lb

## 2018-03-19 DIAGNOSIS — Z01419 Encounter for gynecological examination (general) (routine) without abnormal findings: Secondary | ICD-10-CM | POA: Diagnosis not present

## 2018-03-19 DIAGNOSIS — Z1151 Encounter for screening for human papillomavirus (HPV): Secondary | ICD-10-CM | POA: Diagnosis not present

## 2018-03-19 DIAGNOSIS — Z124 Encounter for screening for malignant neoplasm of cervix: Secondary | ICD-10-CM

## 2018-03-19 MED ORDER — NORETHINDRONE 0.35 MG PO TABS
1.0000 | ORAL_TABLET | Freq: Every day | ORAL | 11 refills | Status: DC
Start: 2018-03-19 — End: 2019-01-09

## 2018-03-19 NOTE — Progress Notes (Signed)
New GYN presents for AEX/PAP.

## 2018-03-19 NOTE — Progress Notes (Signed)
Subjective:     Grace Crosby is a 52 y.o. female G2P2 with BMI 41 is here for a comprehensive physical exam. The patient reports a prolongation in her periods. They are now 5-7 days. She denies any pelvic pain. She reports a bright red or dark discharge a few days prior to the onset of her cycle. She is sexually active without complaints. She denies any urinary incontinence. She is no longer working with a trainer but is walking daily and plans to join a yoga class.  No past medical history on file. Past Surgical History:  Procedure Laterality Date  . APPENDECTOMY  1977  . BREAST BIOPSY Right   . CHOLECYSTECTOMY  2005   Family History  Problem Relation Age of Onset  . Diabetes Mother   . Heart disease Mother   . Glaucoma Mother   . Heart disease Father   . Cancer Maternal Grandmother        cervical  . Stroke Maternal Grandfather   . Stroke Paternal Grandmother   . Cancer Paternal Grandfather     Social History   Socioeconomic History  . Marital status: Single    Spouse name: Not on file  . Number of children: Not on file  . Years of education: Not on file  . Highest education level: Not on file  Occupational History  . Not on file  Social Needs  . Financial resource strain: Not on file  . Food insecurity:    Worry: Not on file    Inability: Not on file  . Transportation needs:    Medical: Not on file    Non-medical: Not on file  Tobacco Use  . Smoking status: Never Smoker  . Smokeless tobacco: Never Used  Substance and Sexual Activity  . Alcohol use: No    Alcohol/week: 0.0 standard drinks  . Drug use: No  . Sexual activity: Yes    Partners: Male    Birth control/protection: OCP  Lifestyle  . Physical activity:    Days per week: Not on file    Minutes per session: Not on file  . Stress: Not on file  Relationships  . Social connections:    Talks on phone: Not on file    Gets together: Not on file    Attends religious service: Not on file    Active  member of club or organization: Not on file    Attends meetings of clubs or organizations: Not on file    Relationship status: Not on file  . Intimate partner violence:    Fear of current or ex partner: Not on file    Emotionally abused: Not on file    Physically abused: Not on file    Forced sexual activity: Not on file  Other Topics Concern  . Not on file  Social History Narrative  . Not on file   Health Maintenance  Topic Date Due  . HIV Screening  07/05/1982  . TETANUS/TDAP  07/05/1986  . COLONOSCOPY  07/05/2017  . INFLUENZA VACCINE  01/23/2018  . MAMMOGRAM  11/28/2019  . PAP SMEAR  03/18/2020       Review of Systems Pertinent items are noted in HPI.   Objective:  Blood pressure (!) 152/87, pulse 92, height 5\' 8"  (1.727 m), weight 274 lb 4.8 oz (124.4 kg), last menstrual period 03/06/2018.     GENERAL: Well-developed, well-nourished female in no acute distress.  HEENT: Normocephalic, atraumatic. Sclerae anicteric.  NECK: Supple. Normal thyroid.  LUNGS: Clear to  auscultation bilaterally.  HEART: Regular rate and rhythm. BREASTS: Symmetric in size. No palpable masses or lymphadenopathy, skin changes, or nipple drainage. ABDOMEN: Soft, nontender, nondistended. No organomegaly. PELVIC: Normal external female genitalia. Vagina is pink and rugated.  Normal discharge. Normal appearing cervix. Uterus is normal in size. No adnexal mass or tenderness. EXTREMITIES: No cyanosis, clubbing, or edema, 2+ distal pulses.    Assessment:    Healthy female exam.      Plan:    Pap smear collected Patient with normal mammogram June 2019 Patient with HTN on meds. Discussed changing to progesterone only pill Pelvic ultrasound ordered to assess fibroid uterus and DUB Patient will be contacted with results See After Visit Summary for Counseling Recommendations

## 2018-03-21 LAB — CYTOLOGY - PAP
HPV (WINDOPATH): DETECTED — AB
HPV 16/18/45 genotyping: NEGATIVE

## 2018-03-24 ENCOUNTER — Ambulatory Visit (HOSPITAL_COMMUNITY)
Admission: RE | Admit: 2018-03-24 | Discharge: 2018-03-24 | Disposition: A | Discharge status: Home / Self Care | Payer: 59 | Source: Ambulatory Visit | Attending: Obstetrics and Gynecology | Admitting: Obstetrics and Gynecology

## 2018-03-24 ENCOUNTER — Telehealth: Payer: Self-pay | Admitting: Obstetrics and Gynecology

## 2018-03-24 DIAGNOSIS — Z01419 Encounter for gynecological examination (general) (routine) without abnormal findings: Secondary | ICD-10-CM

## 2018-03-24 DIAGNOSIS — N938 Other specified abnormal uterine and vaginal bleeding: Secondary | ICD-10-CM | POA: Diagnosis present

## 2018-03-24 DIAGNOSIS — D259 Leiomyoma of uterus, unspecified: Secondary | ICD-10-CM | POA: Diagnosis not present

## 2018-03-24 NOTE — Telephone Encounter (Signed)
Peter Congo with [facility] called to inform Dr, Constant of abnormal pap results. Please ret call to [facility].

## 2018-04-14 ENCOUNTER — Other Ambulatory Visit (HOSPITAL_COMMUNITY)
Admission: RE | Admit: 2018-04-14 | Discharge: 2018-04-14 | Disposition: A | Discharge status: Home / Self Care | Payer: 59 | Source: Ambulatory Visit | Attending: Obstetrics and Gynecology | Admitting: Obstetrics and Gynecology

## 2018-04-14 ENCOUNTER — Encounter: Payer: Self-pay | Admitting: Obstetrics and Gynecology

## 2018-04-14 ENCOUNTER — Ambulatory Visit (INDEPENDENT_AMBULATORY_CARE_PROVIDER_SITE_OTHER): Payer: 59 | Admitting: Obstetrics and Gynecology

## 2018-04-14 VITALS — BP 148/97 | HR 99 | Wt 276.4 lb

## 2018-04-14 DIAGNOSIS — Z3202 Encounter for pregnancy test, result negative: Secondary | ICD-10-CM

## 2018-04-14 DIAGNOSIS — R87613 High grade squamous intraepithelial lesion on cytologic smear of cervix (HGSIL): Secondary | ICD-10-CM | POA: Diagnosis not present

## 2018-04-14 DIAGNOSIS — Z01818 Encounter for other preprocedural examination: Secondary | ICD-10-CM | POA: Insufficient documentation

## 2018-04-14 LAB — POCT URINE PREGNANCY: Preg Test, Ur: NEGATIVE

## 2018-04-14 NOTE — Patient Instructions (Signed)
Cervical Conization °Cervical conization (cone biopsy) is a procedure in which a cone-shaped portion of the cervix is cut out so that it can be examined under a microscope. The procedure is done to check for cancer cells or cells that might turn into cancer (precancerous cells). You may have this procedure if: °· You have abnormal bleeding from your cervix. °· You had an abnormal Pap test. °· Something abnormal was seen on your cervix during an exam. ° °This procedure is performed in either a health care provider’s office or in an operating room. °Tell a health care provider about: °· Any allergies you have. °· All medicines you are taking, including vitamins, herbs, eye drops, creams, and over-the-counter medicines. °· Any problems you or family members have had with the use of anesthetic medicines. °· Any blood disorders you have. °· Any surgeries you have had. °· Any medical conditions you have. °· Your smoking habits. °· When you normally have your period. °· Whether you are pregnant or may be pregnant. °What are the risks? °Generally, this is a safe procedure. However, problems may occur, including: °· Heavy bleeding for several days or weeks after the procedure. °· Allergic reactions to medicines or dyes. °· Increased risk of preterm labor in future pregnancies. °· Infection (rare). °· Damage to the cervix or other structures or organs (rare). ° °What happens before the procedure? °Staying hydrated °Follow instructions from your health care provider about hydration, which may include: °· Up to 2 hours before the procedure - you may continue to drink clear liquids, such as water, clear fruit juice, black coffee, and plain tea. ° °Eating and drinking restrictions °Follow instructions from your health care provider about eating and drinking, which may include: °· 8 hours before the procedure - stop eating heavy meals or foods such as meat, fried foods, or fatty foods. °· 6 hours before the procedure - stop eating  light meals or foods, such as toast or cereal. °· 6 hours before the procedure - stop drinking milk or drinks that contain milk. °· 2 hours before the procedure - stop drinking clear liquids. ° °General instructions °· Do not douche, have sex, use tampons, or use any vaginal medicines before the procedure as told by your health care provider. °· You may be asked to empty your bladder and bowel right before the procedure. °· Ask your health care provider about: °? Changing or stopping your normal medicines. This is important if you take diabetes medicines or blood thinners. °? Taking medicines such as aspirin and ibuprofen. These medicines can thin your blood. Do not take these medicines before your procedure if your doctor tells you not to. °· Plan to have someone take you home from the hospital or clinic. °What happens during the procedure? °· To reduce your risk of infection: °? Your health care team will wash or sanitize their hands. °? Your skin will be washed with soap. °? Hair may be removed from the surgical area. °· You will undress from the waist down and be given a gown to wear. °· You will lie on an examining table and put your feet in stirrups. °· An IV tube will be inserted into one of your veins. °· You will be given one or more of the following: °? A medicine to help you relax (sedative). °? A medicine to numb the area (local anesthetic). °? A medicine to make you fall asleep (general anesthetic). °? A medicine that numbs the cervix (cervical block). °·   A lubricated device called a speculum will be inserted into your vagina. It will be used to spread open the walls of the vagina so your health care provider can see the inside of the vagina and cervix better. °· An instrument that has a magnifying lens and a light (colposcope) will let your health care provider examine the cervix more closely. °· Your health care provider will apply a solution to your cervix. This turns abnormal areas a pale  color. °· A tissue sample will be removed from the cervix using one of the following methods: °? The cold knife method. In this method, the tissue is cut out with a knife (scalpel). °? The loop electrosurgical excision procedure (LEEP) method. In this method, the tissue is cut out with a thin wire that can burn (cauterize) the tissue with an electrical current. °? Laser treatment method. In this method, the tissue is cut out and then cauterized with a laser beam to prevent bleeding. °· Your health care provider will apply a paste over the biopsy areas to help control bleeding. °· The tissue sample will be examined under a microscope. °The procedure may vary among health care providers and hospitals. °What happens after the procedure? °· Your blood pressure, heart rate, breathing rate, and blood oxygen level will be monitored often until the medicines you were given have worn off. °· If you were given a local anesthetic, you will rest at the clinic or hospital until you are stable and feel ready to go home. °· If you were given a general anesthetic, you may be monitored for a longer period of time. °· You may have some cramping. °· You may have bloody discharge or light to moderate bleeding. °· You may have dark discharge coming from your vagina. This is from the paste used on the cervix to prevent bleeding. °Summary °· Cervical conization is a procedure in which a cone-shaped portion of the cervix is cut out so that it can be examined under a microscope. °· The procedure is done to check for cancer cells or cells that might turn into cancer (precancerous cells). °This information is not intended to replace advice given to you by your health care provider. Make sure you discuss any questions you have with your health care provider. °Document Released: 03/21/2005 Document Revised: 06/13/2016 Document Reviewed: 06/13/2016 °Elsevier Interactive Patient Education © 2017 [employer] ° °

## 2018-04-14 NOTE — Progress Notes (Signed)
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

## 2018-04-14 NOTE — Addendum Note (Signed)
Addended by: Marquette Old on: 04/14/2018 03:34 PM   Modules accepted: Orders

## 2018-04-14 NOTE — Progress Notes (Signed)
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  Placed in lithotomy position. Cervix viewed with speculum and colposcope after application of acetic acid.   Colposcopy adequate?  yes Acetowhite lesions? Yes 12 and 6 o'clock Punctation? no Mosaicism?  no Abnormal vasculature?  no Biopsies? 12, 4 and 6 o'clock ECC? yes  COMMENTS:  Patient was given post procedure instructions.  She will return in 2 weeks for results and LEEP.  Mora Bellman, MD

## 2018-04-16 ENCOUNTER — Encounter: Payer: Self-pay | Admitting: Obstetrics and Gynecology

## 2018-04-28 ENCOUNTER — Encounter: Payer: Self-pay | Admitting: Obstetrics and Gynecology

## 2018-04-28 ENCOUNTER — Other Ambulatory Visit (HOSPITAL_COMMUNITY)
Admission: RE | Admit: 2018-04-28 | Discharge: 2018-04-28 | Disposition: A | Discharge status: Home / Self Care | Payer: 59 | Source: Ambulatory Visit | Attending: Obstetrics and Gynecology | Admitting: Obstetrics and Gynecology

## 2018-04-28 ENCOUNTER — Ambulatory Visit (INDEPENDENT_AMBULATORY_CARE_PROVIDER_SITE_OTHER): Payer: 59 | Admitting: Obstetrics and Gynecology

## 2018-04-28 VITALS — BP 167/108 | HR 97 | Wt 278.5 lb

## 2018-04-28 DIAGNOSIS — R87613 High grade squamous intraepithelial lesion on cytologic smear of cervix (HGSIL): Secondary | ICD-10-CM

## 2018-04-28 NOTE — Progress Notes (Signed)
Patient identified, informed consent obtained, signed copy in chart, time out performed.  Pap smear and colposcopy reviewed.   Pap HGSIL Colpo Biopsy CIN 2 ECC benign Teflon coated speculum with smoke evacuator placed.  Cervix visualized. Paracervical block placed.  Large size LOOP used to remove cone of cervix using blend of cut and cautery on LEEP machine.  Edges/Base cauterized with Ball.  Monsel's solution used for hemostasis.  Patient tolerated procedure well.  Patient given post procedure instructions.  Follow up in 4 months for repeat pap or as needed. Patient scheduled to follow up with PCP on 11/18 for HTN

## 2018-10-24 HISTORY — PX: BARIATRIC SURGERY: SHX1103

## 2019-01-09 ENCOUNTER — Ambulatory Visit (INDEPENDENT_AMBULATORY_CARE_PROVIDER_SITE_OTHER): Payer: 59 | Admitting: Obstetrics and Gynecology

## 2019-01-09 ENCOUNTER — Other Ambulatory Visit: Payer: Self-pay

## 2019-01-09 ENCOUNTER — Encounter: Payer: Self-pay | Admitting: Obstetrics and Gynecology

## 2019-01-09 VITALS — BP 156/87 | HR 91 | Wt 254.0 lb

## 2019-01-09 DIAGNOSIS — Z124 Encounter for screening for malignant neoplasm of cervix: Secondary | ICD-10-CM

## 2019-01-09 MED ORDER — NORETHINDRONE 0.35 MG PO TABS: 1.0000 | ORAL_TABLET | Freq: Every day | ORAL | 4 refills | Status: DC

## 2019-01-09 NOTE — Addendum Note (Signed)
Addended by: Mora Bellman on: 01/09/2019 10:53 AM   Modules accepted: Orders

## 2019-01-09 NOTE — Progress Notes (Signed)
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 Patient is doing well reporting amenorrhea since 04/2018 with the exception of this past month where she experienced a heavier 10-day cycle. Patient reports occasional vaginal spotting today. Patient is otherwise without complaints  History reviewed. No pertinent past medical history. Past Surgical History:  Procedure Laterality Date  . APPENDECTOMY  1977  . BREAST BIOPSY Right   . CHOLECYSTECTOMY  2005   Family History  Problem Relation Age of Onset  . Diabetes Mother   . Heart disease Mother   . Glaucoma Mother   . Heart disease Father   . Cancer Maternal Grandmother        cervical  . Stroke Maternal Grandfather   . Stroke Paternal Grandmother   . Cancer Paternal Grandfather    Social History   Tobacco Use  . Smoking status: Never Smoker  . Smokeless tobacco: Never Used  Substance Use Topics  . Alcohol use: No    Alcohol/week: 0.0 standard drinks  . Drug use: No   ROS See pertinent in HPI  Vitals:   01/09/19 1028  BP: (!) 156/87  Pulse: 91    GENERAL: Well-developed, well-nourished female in no acute distress.  ABDOMEN: Soft, nontender, nondistended. No organomegaly. PELVIC: Normal external female genitalia. Vagina is pink and rugated.  Normal discharge. Normal appearing cervix. Uterus is normal in size. No adnexal mass or tenderness. EXTREMITIES: No cyanosis, clubbing, or edema, 2+ distal pulses.  A/P 53 yo here for repeat pap smear - patient will be contacted with abnormal results - Patient scheduled for screening mammogram in August - Patient advised to continue monitoring menstrual cycle and to return if DUB persists.   - RTC prn

## 2019-01-09 NOTE — Progress Notes (Signed)
RGYN pt presents for pap.  Pt states she has had vaginal bleeding since 12/23/18 pt states bleeding was heavy w/ clots pt notes she is still bleeding today Wants to discuss bleeding with provider.     LEEP: 04/28/18

## 2019-01-13 LAB — CYTOLOGY - PAP
Adequacy: ABSENT
Diagnosis: NEGATIVE

## 2019-01-26 ENCOUNTER — Other Ambulatory Visit: Payer: Self-pay | Admitting: Family Medicine

## 2019-01-26 DIAGNOSIS — Z1231 Encounter for screening mammogram for malignant neoplasm of breast: Secondary | ICD-10-CM

## 2019-01-29 ENCOUNTER — Ambulatory Visit (INDEPENDENT_AMBULATORY_CARE_PROVIDER_SITE_OTHER): Payer: 59

## 2019-01-29 ENCOUNTER — Other Ambulatory Visit: Payer: Self-pay

## 2019-01-29 DIAGNOSIS — Z1231 Encounter for screening mammogram for malignant neoplasm of breast: Secondary | ICD-10-CM | POA: Diagnosis not present

## 2019-12-16 ENCOUNTER — Other Ambulatory Visit: Payer: Self-pay | Admitting: Family Medicine

## 2019-12-16 DIAGNOSIS — Z1231 Encounter for screening mammogram for malignant neoplasm of breast: Secondary | ICD-10-CM

## 2020-01-13 ENCOUNTER — Ambulatory Visit: Payer: 59 | Admitting: Obstetrics and Gynecology

## 2020-02-02 ENCOUNTER — Other Ambulatory Visit: Payer: Self-pay | Admitting: Obstetrics and Gynecology

## 2020-02-03 ENCOUNTER — Other Ambulatory Visit: Payer: Self-pay

## 2020-02-03 ENCOUNTER — Ambulatory Visit (INDEPENDENT_AMBULATORY_CARE_PROVIDER_SITE_OTHER): Payer: 59

## 2020-02-03 DIAGNOSIS — Z1231 Encounter for screening mammogram for malignant neoplasm of breast: Secondary | ICD-10-CM | POA: Diagnosis not present

## 2020-02-05 ENCOUNTER — Telehealth: Payer: Self-pay

## 2020-02-05 MED ORDER — NORETHINDRONE 0.35 MG PO TABS: 1.0000 | ORAL_TABLET | Freq: Every day | ORAL | 0 refills | Status: DC

## 2020-02-05 NOTE — Telephone Encounter (Signed)
Returned call and patient is scheduled for annual on 02-18-20 and is requesting refill on BC pills until appt, refilled per protocol.

## 2020-02-18 ENCOUNTER — Other Ambulatory Visit: Payer: Self-pay

## 2020-02-18 ENCOUNTER — Other Ambulatory Visit (HOSPITAL_COMMUNITY)
Admission: RE | Admit: 2020-02-18 | Discharge: 2020-02-18 | Disposition: A | Discharge status: Home / Self Care | Payer: 59 | Source: Ambulatory Visit | Attending: Obstetrics and Gynecology | Admitting: Obstetrics and Gynecology

## 2020-02-18 ENCOUNTER — Ambulatory Visit (INDEPENDENT_AMBULATORY_CARE_PROVIDER_SITE_OTHER): Payer: 59 | Admitting: Obstetrics and Gynecology

## 2020-02-18 ENCOUNTER — Encounter: Payer: Self-pay | Admitting: Obstetrics and Gynecology

## 2020-02-18 VITALS — BP 143/74 | HR 85 | Wt 149.5 lb

## 2020-02-18 DIAGNOSIS — Z01419 Encounter for gynecological examination (general) (routine) without abnormal findings: Secondary | ICD-10-CM | POA: Insufficient documentation

## 2020-02-18 NOTE — Progress Notes (Signed)
Subjective:     Grace Crosby is a 54 y.o. female P2 with BMI 22 who is here for a comprehensive physical exam. The patient reports no problems. She is sexually active without complaints. She reports often skipping periods with her las period being in March. She denies pelvic pain or abnormal discharge. She endorses occasional vasomotor symptoms. Patient denies urinary incontinence. Patient was seen by PCP yesterday and had blood work done  No past medical history on file. Past Surgical History:  Procedure Laterality Date  . APPENDECTOMY  1977  . BREAST BIOPSY Right   . CHOLECYSTECTOMY  2005   Family History  Problem Relation Age of Onset  . Diabetes Mother   . Heart disease Mother   . Glaucoma Mother   . Heart disease Father   . Cancer Maternal Grandmother        cervical  . Stroke Maternal Grandfather   . Stroke Paternal Grandmother   . Cancer Paternal Grandfather     Social History   Socioeconomic History  . Marital status: Married    Spouse name: Not on file  . Number of children: Not on file  . Years of education: Not on file  . Highest education level: Not on file  Occupational History  . Not on file  Tobacco Use  . Smoking status: Never Smoker  . Smokeless tobacco: Never Used  Vaping Use  . Vaping Use: Never used  Substance and Sexual Activity  . Alcohol use: No    Alcohol/week: 0.0 standard drinks  . Drug use: No  . Sexual activity: Yes    Partners: Male    Birth control/protection: OCP  Other Topics Concern  . Not on file  Social History Narrative  . Not on file   Social Determinants of Health   Financial Resource Strain:   . Difficulty of Paying Living Expenses: Not on file  Food Insecurity:   . Worried About Charity fundraiser in the Last Year: Not on file  . Ran Out of Food in the Last Year: Not on file  Transportation Needs:   . Lack of Transportation (Medical): Not on file  . Lack of Transportation (Non-Medical): Not on file  Physical  Activity:   . Days of Exercise per Week: Not on file  . Minutes of Exercise per Session: Not on file  Stress:   . Feeling of Stress : Not on file  Social Connections:   . Frequency of Communication with Friends and Family: Not on file  . Frequency of Social Gatherings with Friends and Family: Not on file  . Attends Religious Services: Not on file  . Active Member of Clubs or Organizations: Not on file  . Attends Archivist Meetings: Not on file  . Marital Status: Not on file  Intimate Partner Violence:   . Fear of Current or Ex-Partner: Not on file  . Emotionally Abused: Not on file  . Physically Abused: Not on file  . Sexually Abused: Not on file   Health Maintenance  Topic Date Due  . Hepatitis C Screening  Never done  . COVID-19 Vaccine (1) Never done  . HIV Screening  Never done  . COLONOSCOPY  Never done  . TETANUS/TDAP  09/24/2019  . INFLUENZA VACCINE  01/24/2020  . PAP SMEAR-Modifier  01/08/2022  . MAMMOGRAM  02/02/2022       Review of Systems Pertinent items noted in HPI and remainder of comprehensive ROS otherwise negative.   Objective:  Blood pressure Marland Kitchen)  143/74, pulse 85, weight 149 lb 8 oz (67.8 kg), last menstrual period 09/07/2019.     GENERAL: Well-developed, well-nourished female in no acute distress.  HEENT: Normocephalic, atraumatic. Sclerae anicteric.  NECK: Supple. Normal thyroid.  LUNGS: Clear to auscultation bilaterally.  HEART: Regular rate and rhythm. BREASTS: Symmetric in size. No palpable masses or lymphadenopathy, skin changes, or nipple drainage. ABDOMEN: Soft, nontender, nondistended. No organomegaly. PELVIC: Normal external female genitalia. Vagina is pink and rugated.  Normal discharge. Normal appearing cervix. Uterus is normal in size. No adnexal mass or tenderness. EXTREMITIES: No cyanosis, clubbing, or edema, 2+ distal pulses.    Assessment:    Healthy female exam.      Plan:    Pap smear collected today Patient with  normal mammogram 01/2020 Patient will be contacted with abnormal results RTC prn See After Visit Summary for Counseling Recommendations

## 2020-02-18 NOTE — Progress Notes (Signed)
Pt is in the office. Last pap 01-09-19. Pt currently on BC pills, states that cycles are no longer regular, LMP estimated 09-07-19, denies abnormal symptoms. Pt does not want std testing.

## 2020-02-19 LAB — CERVICOVAGINAL ANCILLARY ONLY
Bacterial Vaginitis (gardnerella): NEGATIVE
Candida Glabrata: NEGATIVE
Candida Vaginitis: NEGATIVE
Comment: NEGATIVE
Comment: NEGATIVE
Comment: NEGATIVE

## 2020-02-22 LAB — CYTOLOGY - PAP
Adequacy: ABSENT
Comment: NEGATIVE
Diagnosis: NEGATIVE
High risk HPV: NEGATIVE

## 2020-02-25 ENCOUNTER — Other Ambulatory Visit: Payer: Self-pay | Admitting: Obstetrics and Gynecology

## 2020-03-01 ENCOUNTER — Other Ambulatory Visit: Payer: Self-pay

## 2020-03-01 DIAGNOSIS — Z3041 Encounter for surveillance of contraceptive pills: Secondary | ICD-10-CM

## 2020-03-01 MED ORDER — NORETHINDRONE 0.35 MG PO TABS: 1.0000 | ORAL_TABLET | Freq: Every day | ORAL | 11 refills | Status: DC

## 2020-12-20 ENCOUNTER — Other Ambulatory Visit: Payer: Self-pay | Admitting: Family Medicine

## 2020-12-20 DIAGNOSIS — Z1231 Encounter for screening mammogram for malignant neoplasm of breast: Secondary | ICD-10-CM

## 2021-01-23 ENCOUNTER — Ambulatory Visit: Payer: 59 | Admitting: Obstetrics and Gynecology

## 2021-01-31 ENCOUNTER — Other Ambulatory Visit: Payer: Self-pay | Admitting: Obstetrics and Gynecology

## 2021-01-31 DIAGNOSIS — Z3041 Encounter for surveillance of contraceptive pills: Secondary | ICD-10-CM

## 2021-02-02 ENCOUNTER — Telehealth: Payer: Self-pay | Admitting: Obstetrics and Gynecology

## 2021-02-02 DIAGNOSIS — Z3041 Encounter for surveillance of contraceptive pills: Secondary | ICD-10-CM

## 2021-02-02 MED ORDER — NORETHINDRONE 0.35 MG PO TABS: 1.0000 | ORAL_TABLET | Freq: Every day | ORAL | 0 refills | Status: DC

## 2021-02-02 NOTE — Telephone Encounter (Signed)
Patient called requesting a refill on her Norethindrone prescription.  She had to reschedule her annual and her refills have run out.  One refill sent to pharmacy.  Patient scheduled for annual on 03/01/21.

## 2021-02-08 ENCOUNTER — Other Ambulatory Visit: Payer: Self-pay

## 2021-02-08 ENCOUNTER — Ambulatory Visit (INDEPENDENT_AMBULATORY_CARE_PROVIDER_SITE_OTHER): Payer: 59

## 2021-02-08 DIAGNOSIS — Z1231 Encounter for screening mammogram for malignant neoplasm of breast: Secondary | ICD-10-CM | POA: Diagnosis not present

## 2021-02-26 ENCOUNTER — Other Ambulatory Visit: Payer: Self-pay | Admitting: Obstetrics and Gynecology

## 2021-02-26 DIAGNOSIS — Z3041 Encounter for surveillance of contraceptive pills: Secondary | ICD-10-CM

## 2021-03-01 ENCOUNTER — Other Ambulatory Visit (HOSPITAL_COMMUNITY)
Admission: RE | Admit: 2021-03-01 | Discharge: 2021-03-01 | Disposition: A | Discharge status: Home / Self Care | Payer: 59 | Source: Ambulatory Visit | Attending: Obstetrics and Gynecology | Admitting: Obstetrics and Gynecology

## 2021-03-01 ENCOUNTER — Ambulatory Visit (INDEPENDENT_AMBULATORY_CARE_PROVIDER_SITE_OTHER): Payer: 59 | Admitting: Obstetrics and Gynecology

## 2021-03-01 ENCOUNTER — Encounter: Payer: Self-pay | Admitting: Obstetrics and Gynecology

## 2021-03-01 ENCOUNTER — Other Ambulatory Visit: Payer: Self-pay

## 2021-03-01 VITALS — BP 128/87 | HR 89 | Ht 68.0 in | Wt 170.0 lb

## 2021-03-01 DIAGNOSIS — Z01419 Encounter for gynecological examination (general) (routine) without abnormal findings: Secondary | ICD-10-CM | POA: Insufficient documentation

## 2021-03-01 NOTE — Progress Notes (Signed)
Patient presents for Annual Exam today.  LMP: 08/2019 no period since  Last pap:02/18/20  WNL  Mammogram: 02/08/21 WNL STD Screening: Declines   CC: None wants to discuss birth control currently taking pills.

## 2021-03-01 NOTE — Progress Notes (Signed)
Subjective:     Grace Crosby is a 55 y.o. female P2 with BMI 25 who is here for a comprehensive physical exam. The patient reports no problems. Patient reports amenorrhea since 08/2019 making her in menopause. She denies any episodes of vaginal bleeding since 08/2020. She denies pelvic pain or abnormal discharge. She denies urinary incontinence. She is sexually active without complaints, although she admits to a decrease in her libido. She is without any other complaints  History reviewed. No pertinent past medical history. Past Surgical History:  Procedure Laterality Date   APPENDECTOMY  1977   BARIATRIC SURGERY  10/2018   BREAST BIOPSY Right    CHOLECYSTECTOMY  2005   Family History  Problem Relation Age of Onset   Diabetes Mother    Heart disease Mother    Glaucoma Mother    Heart disease Father    Cancer Maternal Grandmother        cervical   Stroke Maternal Grandfather    Stroke Paternal Grandmother    Cancer Paternal Grandfather     Social History   Socioeconomic History   Marital status: Married    Spouse name: Not on file   Number of children: Not on file   Years of education: Not on file   Highest education level: Not on file  Occupational History   Not on file  Tobacco Use   Smoking status: Never   Smokeless tobacco: Never  Vaping Use   Vaping Use: Never used  Substance and Sexual Activity   Alcohol use: No    Alcohol/week: 0.0 standard drinks   Drug use: No   Sexual activity: Yes    Partners: Male    Birth control/protection: OCP, Pill  Other Topics Concern   Not on file  Social History Narrative   Not on file   Social Determinants of Health   Financial Resource Strain: Not on file  Food Insecurity: Not on file  Transportation Needs: Not on file  Physical Activity: Not on file  Stress: Not on file  Social Connections: Not on file  Intimate Partner Violence: Not on file   Health Maintenance  Topic Date Due   HIV Screening  Never done    Hepatitis C Screening  Never done   COLONOSCOPY (Pts 45-54yr Insurance coverage will need to be confirmed)  Never done   Zoster Vaccines- Shingrix (1 of 2) Never done   INFLUENZA VACCINE  Never done   COVID-19 Vaccine (4 - Booster for Pfizer series) 03/12/2021   MAMMOGRAM  02/09/2023   PAP SMEAR-Modifier  02/18/2023   TETANUS/TDAP  02/24/2031   Pneumococcal Vaccine [id number] old  Aged Out   HPV VACCINES  Aged Out       Review of Systems Pertinent items noted in HPI and remainder of comprehensive ROS otherwise negative.   Objective:  Blood pressure 128/87, pulse 89, height '5\' 8"'$  (1.727 m), weight 170 lb (77.1 kg).   GENERAL: Well-developed, well-nourished female in no acute distress.  HEENT: Normocephalic, atraumatic. Sclerae anicteric.  NECK: Supple. Normal thyroid.  LUNGS: Clear to auscultation bilaterally.  HEART: Regular rate and rhythm. BREASTS: Symmetric in size. No palpable masses or lymphadenopathy, skin changes, or nipple drainage. ABDOMEN: Soft, nontender, nondistended. No organomegaly. PELVIC: Normal external female genitalia. Vagina is pink and rugated.  Normal discharge. Normal appearing cervix. Uterus is normal in size. No adnexal mass or tenderness. Chaperone present during the pelvic exam EXTREMITIES: No cyanosis, clubbing, or edema, 2+ distal pulses.  Assessment:    Healthy female exam.      Plan:    Pap smear collected Patient had a mammogram 02/08/21 Patient had a colonoscopy 3 years ago  Patient with blood work done by PCP Patient advised to discontinue birth control pills and no refills will be provided Patient will be contacted with abnormal results See After Visit Summary for Counseling Recommendations

## 2021-03-07 LAB — CYTOLOGY - PAP: Diagnosis: NEGATIVE

## 2022-03-05 ENCOUNTER — Other Ambulatory Visit (HOSPITAL_BASED_OUTPATIENT_CLINIC_OR_DEPARTMENT_OTHER): Payer: Self-pay | Admitting: Family Medicine

## 2022-03-05 DIAGNOSIS — Z1231 Encounter for screening mammogram for malignant neoplasm of breast: Secondary | ICD-10-CM

## 2022-03-08 ENCOUNTER — Ambulatory Visit (INDEPENDENT_AMBULATORY_CARE_PROVIDER_SITE_OTHER): Payer: 59

## 2022-03-08 DIAGNOSIS — Z1231 Encounter for screening mammogram for malignant neoplasm of breast: Secondary | ICD-10-CM

## 2022-04-26 ENCOUNTER — Encounter: Payer: Self-pay | Admitting: Obstetrics and Gynecology

## 2022-04-26 ENCOUNTER — Ambulatory Visit (INDEPENDENT_AMBULATORY_CARE_PROVIDER_SITE_OTHER): Payer: 59 | Admitting: Obstetrics and Gynecology

## 2022-04-26 ENCOUNTER — Other Ambulatory Visit (HOSPITAL_COMMUNITY)
Admission: RE | Admit: 2022-04-26 | Discharge: 2022-04-26 | Disposition: A | Discharge status: Home / Self Care | Payer: 59 | Source: Ambulatory Visit | Attending: Obstetrics and Gynecology | Admitting: Obstetrics and Gynecology

## 2022-04-26 VITALS — BP 132/86 | HR 72 | Ht 68.5 in | Wt 174.0 lb

## 2022-04-26 DIAGNOSIS — R6882 Decreased libido: Secondary | ICD-10-CM

## 2022-04-26 DIAGNOSIS — Z01419 Encounter for gynecological examination (general) (routine) without abnormal findings: Secondary | ICD-10-CM | POA: Insufficient documentation

## 2022-04-26 MED ORDER — ADDYI 100 MG PO TABS: 100.0000 mg | ORAL_TABLET | Freq: Every day | ORAL | 10 refills | Status: AC

## 2022-04-26 NOTE — Progress Notes (Signed)
Pt is wanting pap only for AEX. Pt is UTD on mammo- 9/23. Pt had colonoscopy at age 56.

## 2022-04-26 NOTE — Progress Notes (Signed)
Subjective:     Grace Crosby is a 56 y.o. female postmenopausal with BMI 26 who is here for a comprehensive physical exam. The patient reports no problems. She denies any episodes of postmenopausal vaginal bleeding. She is sexually active without dyspareunia. She denies pelvic pain or abnormal discharge. She denies urinary symptoms or constipation. Patient reports continued decrease in libido  History reviewed. No pertinent past medical history. Past Surgical History:  Procedure Laterality Date   APPENDECTOMY  1977   BARIATRIC SURGERY  10/2018   BREAST BIOPSY Right    CHOLECYSTECTOMY  2005   Family History  Problem Relation Age of Onset   Diabetes Mother    Heart disease Mother    Glaucoma Mother    Heart disease Father    Cancer Maternal Grandmother        cervical   Stroke Maternal Grandfather    Stroke Paternal Grandmother    Cancer Paternal Grandfather     Social History   Socioeconomic History   Marital status: Married    Spouse name: Not on file   Number of children: Not on file   Years of education: Not on file   Highest education level: Not on file  Occupational History   Not on file  Tobacco Use   Smoking status: Never   Smokeless tobacco: Never  Vaping Use   Vaping Use: Never used  Substance and Sexual Activity   Alcohol use: No    Alcohol/week: 0.0 standard drinks of alcohol   Drug use: No   Sexual activity: Yes    Partners: Male  Other Topics Concern   Not on file  Social History Narrative   Not on file   Social Determinants of Health   Financial Resource Strain: Not on file  Food Insecurity: Not on file  Transportation Needs: Not on file  Physical Activity: Not on file  Stress: Not on file  Social Connections: Not on file  Intimate Partner Violence: Not on file   Health Maintenance  Topic Date Due   HIV Screening  Never done   Hepatitis C Screening  Never done   Zoster Vaccines- Shingrix (1 of 2) Never done   COLONOSCOPY (Pts  45-31yr Insurance coverage will need to be confirmed)  Never done   COVID-19 Vaccine (4 - Pfizer risk series) 01/04/2021   PAP SMEAR-Modifier  03/01/2024   MAMMOGRAM  03/08/2024   TETANUS/TDAP  02/24/2031   INFLUENZA VACCINE  Completed   HPV VACCINES  Aged Out       Review of Systems Pertinent items noted in HPI and remainder of comprehensive ROS otherwise negative.   Objective:  Blood pressure 132/86, pulse 72, height 5' 8.5" (1.74 m), weight 174 lb (78.9 kg).   GENERAL: Well-developed, well-nourished female in no acute distress.  HEENT: Normocephalic, atraumatic. Sclerae anicteric.  NECK: Supple. Normal thyroid.  LUNGS: Clear to auscultation bilaterally.  HEART: Regular rate and rhythm. BREASTS: Symmetric in size. No palpable masses or lymphadenopathy, skin changes, or nipple drainage. ABDOMEN: Soft, nontender, nondistended. No organomegaly. PELVIC: Normal external female genitalia. Vagina is pink and rugated.  Normal discharge. Normal appearing cervix. Uterus is normal in size. No adnexal mass or tenderness. Chaperone present during the pelvic exam EXTREMITIES: No cyanosis, clubbing, or edema, 2+ distal pulses.     Assessment:    Healthy female exam.      Plan:    Pap smear collected Patient is current with mammogram and colonoscopy Rx Addyi provided Patient will be contacted with  abnormal results See After Visit Summary for Counseling Recommendations

## 2022-04-30 LAB — CYTOLOGY - PAP: Diagnosis: NEGATIVE

## 2023-01-21 ENCOUNTER — Other Ambulatory Visit: Payer: Self-pay | Admitting: Family Medicine

## 2023-01-21 DIAGNOSIS — Z1231 Encounter for screening mammogram for malignant neoplasm of breast: Secondary | ICD-10-CM

## 2023-03-13 ENCOUNTER — Ambulatory Visit: Payer: 59

## 2023-03-20 ENCOUNTER — Ambulatory Visit: Payer: 59

## 2023-03-20 DIAGNOSIS — Z1231 Encounter for screening mammogram for malignant neoplasm of breast: Secondary | ICD-10-CM

## 2023-04-29 IMAGING — MG MM DIGITAL SCREENING BILAT W/ TOMO AND CAD
6 of 10 series · 6 of 30 positions shown · non-contrast
Comparison: Previous exam(s).

CLINICAL DATA: Screening.

EXAM:
DIGITAL SCREENING BILATERAL MAMMOGRAM WITH TOMOSYNTHESIS AND CAD
TECHNIQUE: Bilateral screening digital craniocaudal and mediolateral oblique
mammograms were obtained. Bilateral screening digital breast
tomosynthesis was performed. The images were evaluated with
computer-aided detection.

[R CC synth-2D]
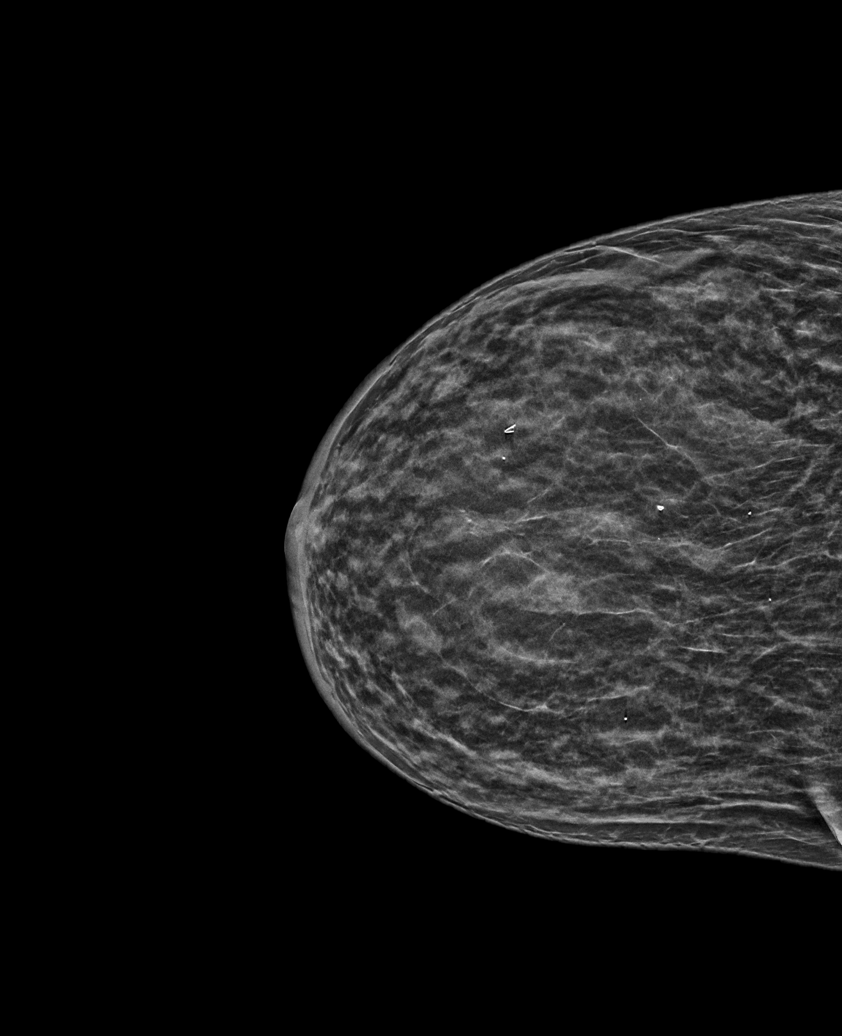

[L MLO synth-2D (1 of 2)]
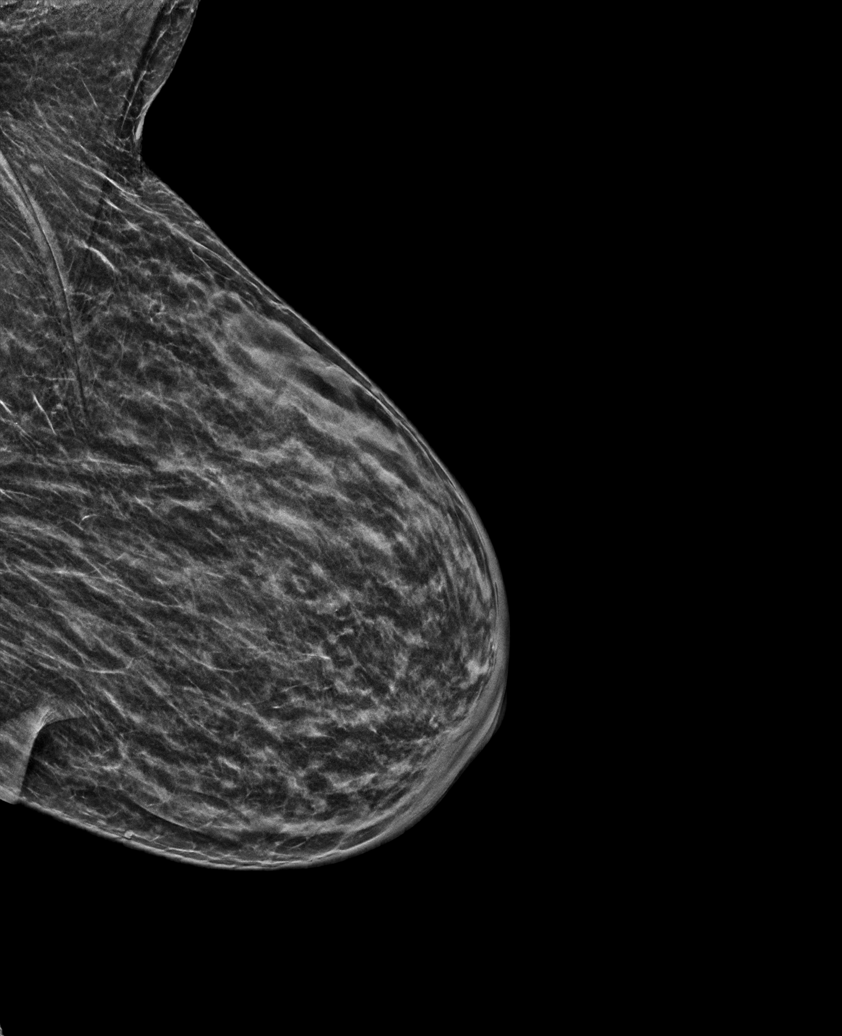

[R MLO synth-2D]
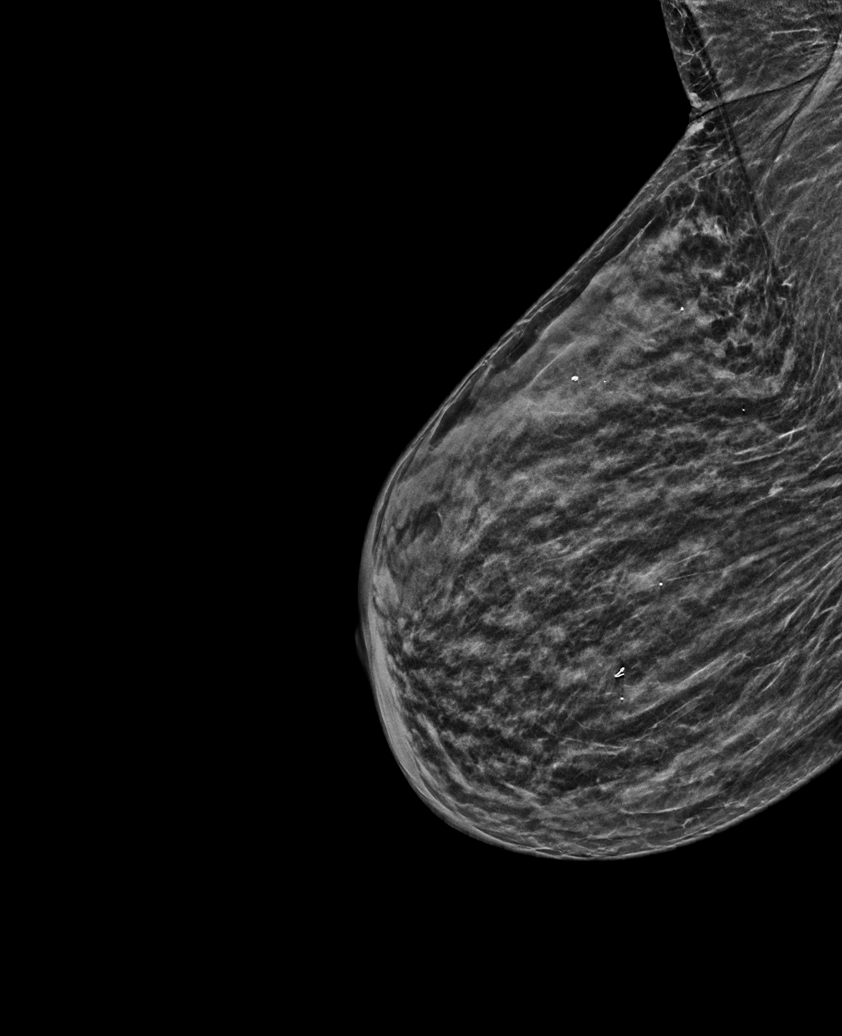

[L CC synth-2D]
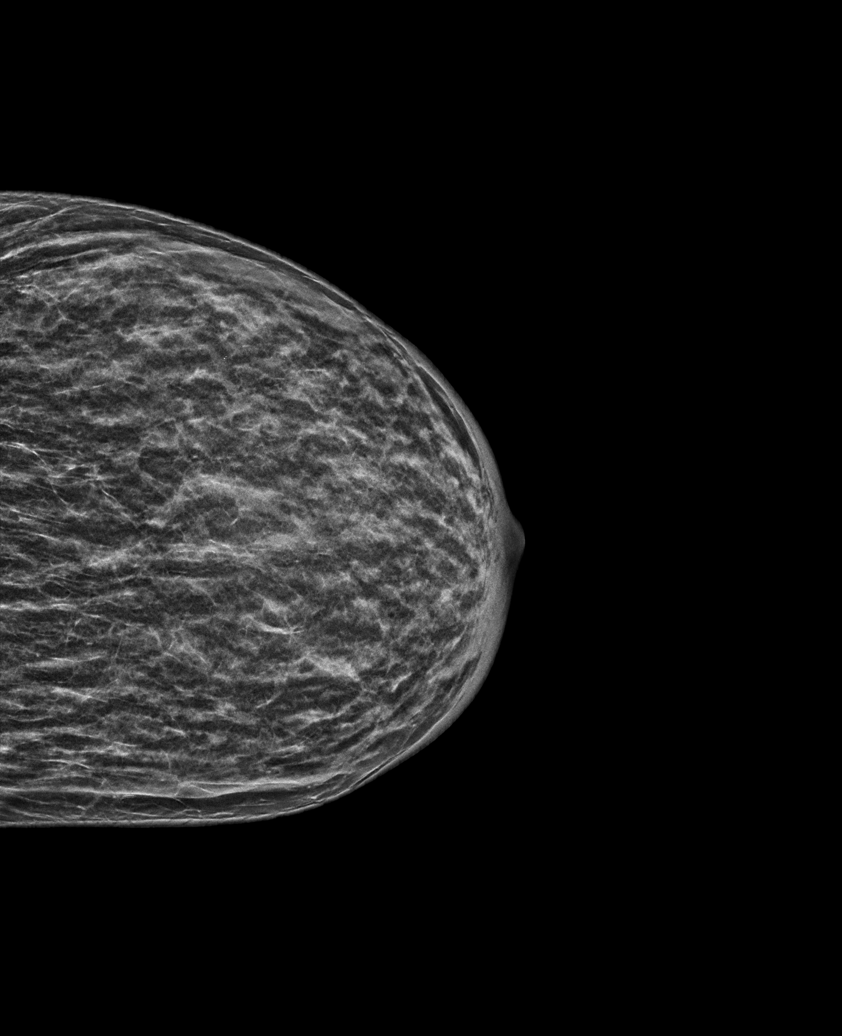

[L MLO synth-2D (2 of 2)]
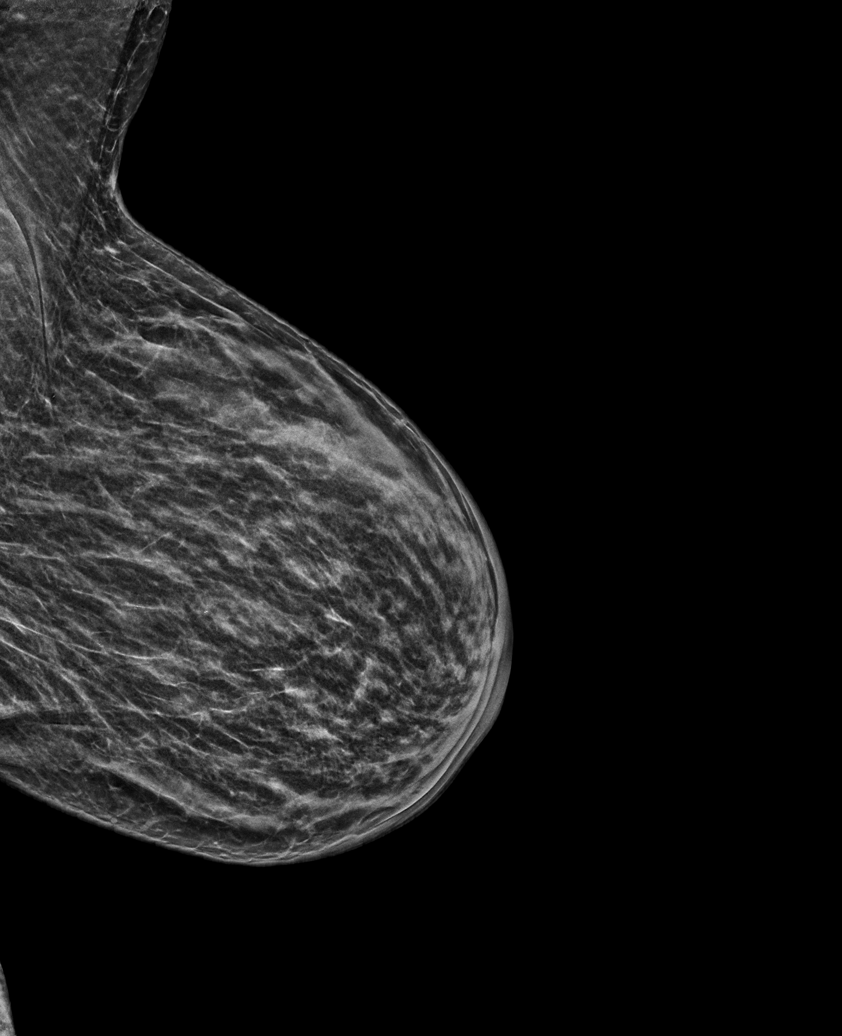

[L MLO tomo · tomo slice 23/44.0]
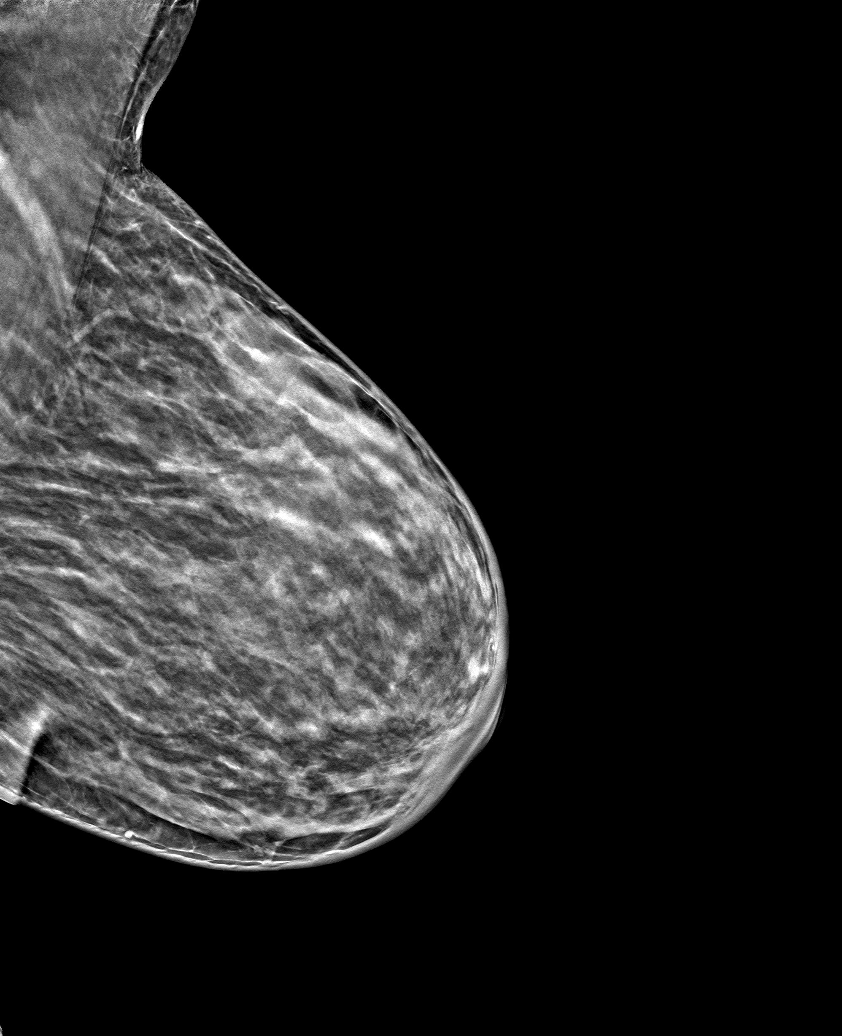

[6 of 30 positions shown; findings below may reference images not displayed]

ACR Breast Density Category d: The breast tissue is extremely dense,
which lowers the sensitivity of mammography
FINDINGS: There are no findings suspicious for malignancy.
IMPRESSION: No mammographic evidence of malignancy. A result letter of this
screening mammogram will be mailed directly to the patient.

RECOMMENDATION:
Screening mammogram in one year. (Code:TA-V-WV9)

BI-RADS CATEGORY  1: Negative.

## 2024-04-13 ENCOUNTER — Other Ambulatory Visit: Payer: Self-pay | Admitting: Nurse Practitioner

## 2024-04-13 ENCOUNTER — Other Ambulatory Visit: Payer: Self-pay | Admitting: Family Medicine

## 2024-04-13 DIAGNOSIS — Z1231 Encounter for screening mammogram for malignant neoplasm of breast: Secondary | ICD-10-CM

## 2024-04-25 ENCOUNTER — Ambulatory Visit (HOSPITAL_BASED_OUTPATIENT_CLINIC_OR_DEPARTMENT_OTHER)
Admission: RE | Admit: 2024-04-25 | Discharge: 2024-04-25 | Disposition: A | Discharge status: Home / Self Care | Source: Ambulatory Visit | Attending: Nurse Practitioner | Admitting: Nurse Practitioner

## 2024-04-25 DIAGNOSIS — Z1231 Encounter for screening mammogram for malignant neoplasm of breast: Secondary | ICD-10-CM | POA: Insufficient documentation
